# Patient Record
Sex: Male | Born: 1995 | Race: Black or African American | Hispanic: No | Marital: Single | Smoking: Current some day smoker
Health system: Southern US, Community
[De-identification: ages and names within clinical notes are randomized; demographics above are authoritative.]

## PROBLEM LIST (undated history)

## (undated) DIAGNOSIS — J45909 Unspecified asthma, uncomplicated: Secondary | ICD-10-CM

## (undated) DIAGNOSIS — K219 Gastro-esophageal reflux disease without esophagitis: Secondary | ICD-10-CM

## (undated) DIAGNOSIS — R569 Unspecified convulsions: Secondary | ICD-10-CM

## (undated) DIAGNOSIS — IMO0001 Reserved for inherently not codable concepts without codable children: Secondary | ICD-10-CM

## (undated) HISTORY — DX: Unspecified asthma, uncomplicated: J45.909

## (undated) HISTORY — PX: CONCEALED PENIS REPAIR: SHX1387

## (undated) HISTORY — PX: DENTAL SURGERY: SHX609

## (undated) HISTORY — DX: Unspecified convulsions: R56.9

---
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
## 2015-01-03 ENCOUNTER — Ambulatory Visit
Admit: 2015-01-03 | Disposition: A | Discharge status: Home / Self Care | Payer: Self-pay | Attending: Family Medicine | Admitting: Family Medicine

## 2015-01-16 NOTE — H&P (Signed)
PATIENT NAME:  Jordan Ferguson, Azam F MR#:  [id number] DATE OF BIRTH:  May 15, 1996  DATE OF ADMISSION:  04/01/2013  REFERRING PHYSICIAN: Dr. Brandt LoosenJulie Manly.   PRIMARY CARE PHYSICIAN: Dr. Fredric MareBailey in [place].   CHIEF COMPLAINT: Shortness of breath.   HISTORY OF PRESENT ILLNESS: This is a 19 year old young male with significant past medical history of asthma. Reports the last time he was admitted was before 2 years as an inpatient for asthma exacerbation, but reports he was never intubated. The patient reports he has been complaining of shortness of breath, cough and wheezing for the last 48 hours. Was yesterday at Urgent Care, where he was given IM steroid shot and was started on p.o. prednisone tapering dose. The patient reports he has been taking his Xopenex nebulizer regularly. As well, he has been taking his albuterol inhalers frequently without much improvement of his symptoms which prompted him to come to the Emergency Department. Initially, the patient was saturating in the high 80s on room air. He had significant wheezing. He received nebulizer and magnesium sulfate with improvement of his wheezing and symptoms, but the fact that the patient still remained tachycardic and with low oxygen saturation, hospitalist service was requested to admit the patient for further management and treatment of his asthma exacerbation. The patient's chest x-ray does not show any opacity or infiltrate. The patient denies any productive sputum or phlegm. Denies chest pain or hemoptysis. Denies any fever or chills.   PAST MEDICAL HISTORY:  1. Asthma.  2. Seizure in childhood, resolved.   PAST SURGICAL HISTORY: None.   SOCIAL HISTORY: No history of smoking or alcohol abuse. The patient is in high school.   FAMILY HISTORY: Significant for asthma in his mother.   ALLERGIES:  1. AMOXICILLIN.  2. LAMICTAL.  3. PENICILLIN.   HOME MEDICATIONS:  1. Ventolin inhalational 4 to 6 times a day as needed.  2. Xopenex nebulizers.   3. Was started yesterday on p.o. prednisone tapering dose.   REVIEW OF SYSTEMS:  CONSTITUTIONAL: Denies fever, chills, fatigue, weakness, weight gain, weight loss.  EYES: Denies blurry vision, double vision, pain, inflammation.  ENT: Denies tinnitus, ear pain, hearing loss, epistaxis or discharge.  RESPIRATORY: Complains of cough but denies any sputum or productive phlegm. Had wheezing, dyspnea. Denies any COPD or hemoptysis.  CARDIOVASCULAR: Denies chest pain, edema, palpitations, syncope.  GASTROINTESTINAL: Denies nausea, vomiting, diarrhea, abdominal pain, jaundice, rectal bleed, bright red blood per rectum or melena.  GENITOURINARY: Denies dysuria, hematuria or renal colic.  ENDOCRINE: Denies polyuria or polydipsia, heat or cold intolerance.  HEMATOLOGY: Denies anemia, easy bruising, bleeding diathesis  INTEGUMENTARY: Denies acne, rash or skin lesions.  MUSCULOSKELETAL: Denies arthritis, cramps, any joint swelling or pain.  NEUROLOGIC: Denies weakness, dysarthria, epilepsy, tremors migraine.  PSYCHIATRIC: Denies anxiety, insomnia, substance abuse.   PHYSICAL EXAMINATION:  VITAL SIGNS: Temperature 98, pulse 124, respiratory rate 24, blood pressure 130/61, saturating 92% on room air.  GENERAL: Young man, looks comfortable in bed, in no apparent distress.  HEENT: Head atraumatic, normocephalic. Pupils equal and reactive to light. Pink conjunctivae. Anicteric sclerae. Moist oral mucosa.  NECK: Supple. No thyromegaly. No JVD. No stridor.  CHEST: The patient had decreased air entry bilaterally with mild end expiratory wheezing. No rales, rhonchi.  CARDIOVASCULAR: S1, S2 heard. No rubs, murmurs or gallops. Tachycardic.  ABDOMEN: Soft, nontender, nondistended. Bowel sounds present.  EXTREMITIES: No edema. No clubbing. No cyanosis. Pulses +2.  PSYCHIATRIC: Pleasant, appropriate affect. Awake, alert x3. Intact judgment and insight.  SKIN:  Normal skin turgor. Warm and dry.  NEUROLOGIC: Cranial  nerves grossly intact. Motor 5 out of 5. No focal deficits. Sensation is intact to light touch and symmetrical.  LYMPHATICS: No cervical or supraclavicular lymphadenopathy.   PERTINENT LABS: Urinalysis pending. CBC, BMP were ordered, and the results are pending.   Chest x-ray does not show any evidence of opacity or infiltrate, showing mildly hyperinflated lungs.   ASSESSMENT AND PLAN:  1. Asthma exacerbation: This is a 19 year old male with history of asthma. Presents with shortness of breath and wheezing. Will treat for asthma exacerbation. He will be admitted given the fact he is still tachycardic and hypoxic. He will be kept on oxygen to keep oxygen saturation more than 98%. Will be started on intravenous Solu-Medrol 60 every 8 hours. Will be started on ipratropium/levalbuterol every 4 hours and p.r.n. Xopenex in between. He already received magnesium sulfate in the ED. Has no productive sputum. No infiltrate on the chest x-ray, so there is no indication for antibiotics. He will be kept on proton pump inhibitor given the fact he is on intravenous steroids.  2. Tachycardia: This is sinus tachycardia and is related to his asthma exacerbation.  3. Deep vein thrombosis prophylaxis: The patient is young and ambulatory. He will be kept on sequential compression device.   CODE STATUS: FULL CODE.   TOTAL TIME SPENT ON ADMISSION AND PATIENT CARE: 40 minutes.   ____________________________ Starleen Arms, MD dse:gb D: 04/01/2013 05:04:41 ET T: 04/01/2013 05:51:22 ET JOB#: [id number]  cc: Starleen Arms, MD, <Dictator> Jovanni Rash Teena Irani MD ELECTRONICALLY SIGNED 04/01/2013 6:46

## 2015-01-16 NOTE — Discharge Summary (Signed)
PATIENT NAME:  Jordan Ferguson, Jordan Ferguson MR#:  [id number] DATE OF BIRTH:  [birth year]  DATE OF ADMISSION:  04/01/2013 DATE OF DISCHARGE:  04/02/2013  ADMITTING PHYSICIAN: Dr. Randol KernElgergawy.  DISCHARGING PHYSICIAN: Dr. Enid Baasadhika Domitila Stetler.   PRIMARY CARE PHYSICIAN: Dr. [facility].   CONSULTATIONS IN THE HOSPITAL: None.   DISCHARGE DIAGNOSES:  1.  Acute hypoxic respiratory failure.  2.  Right upper lobe pneumonia. 3.  Acute asthma exacerbation.  DISCHARGE HOME MEDICATIONS:  1. Xopenex nebulizer, 3 mL, use one 3 to 4 times a day as needed for shortness of breath, wheezing. 2.  Ventolin inhaler 2 puffs 4 times a day as needed for shortness of breath, wheezing.  3.  Levaquin 500 mg p.o. daily for 5 days.  4.  Prednisone taper, over 6 days.   Discharge home oxygen: None.   DISCHARGE ACTIVITY: As tolerated.    FOLLOWUP INSTRUCTIONS: The patient advised to follow up with either Dr. Tammy SoursScott Bailey or Dr[facility]uer[facility] in 1 week.   LABS AND IMAGING STUDIES PRIOR TO DISCHARGE: WBC 15.0; elevated the steroids. Hemoglobin 14.8, hematocrit 42.0, platelet count is 231.   Sodium 139, potassium 4.2, chloride 107, bicarb 24, BUN 14, creatinine 0.89, glucose 163 and calcium of 8.9.   Strep throat culture has remained negative.   Chest x-ray showing patchy density in the right upper lobe along the minor fissure.   BRIEF HOSPITAL COURSE: Mr. Jordan Ferguson is a 19 year old African American male with a past medical history significant for asthma, admitted to the hospital secondary to acute asthma exacerbation. The patient was found to be hypoxic, requiring 3 liters of oxygen on admission.   1. Acute hypoxic respiratory failure secondary to right upper lobe pneumonia and asthma exacerbation: The patient was treated with IV Solu-Medrol and also nebulizer treatments, and his breathing has significantly improved and he has been ambulating on room air with sats greater than 95% at  this time. He is being tapered to p.o. prednisone and Levaquin is being continued at this time, and he will follow up with his pediatrician in the next week. He already has a prescription for Xopenex as needed for wheezing, and also has a p.r.n. inhaler. His course has been otherwise uneventful in the hospital. His mother has been at the patient's bedside all through his hospital course and all the discharge instructions and the plan of treatment has been discussed with mother prior to discharge.   DISCHARGE CONDITION: Stable.   DISCHARGE DISPOSITION: Home.   Time spent on discharge: Forty minutes.    ____________________________ Enid Baasadhika Mandela Bello, MD rk:dm D: 04/02/2013 15:05:00 ET T: 04/02/2013 15:21:45 ET JOB#: [id number]  cc: Enid Baasadhika Lakeidra Reliford, MD, <Dictator> [facility] Enid BaasADHIKA Chairty Toman MD ELECTRONICALLY SIGNED 04/22/2013 13:23

## 2015-06-15 ENCOUNTER — Ambulatory Visit: Payer: Federal, State, Local not specified - PPO

## 2015-06-15 ENCOUNTER — Encounter: Payer: Self-pay | Admitting: Emergency Medicine

## 2015-06-15 ENCOUNTER — Ambulatory Visit
Admission: EM | Admit: 2015-06-15 | Discharge: 2015-06-15 | Disposition: A | Discharge status: Home / Self Care | Payer: Federal, State, Local not specified - PPO | Attending: Family Medicine | Admitting: Family Medicine

## 2015-06-15 DIAGNOSIS — S92002A Unspecified fracture of left calcaneus, initial encounter for closed fracture: Secondary | ICD-10-CM | POA: Diagnosis not present

## 2015-06-15 DIAGNOSIS — R52 Pain, unspecified: Secondary | ICD-10-CM

## 2015-06-15 DIAGNOSIS — S93105A Unspecified dislocation of left toe(s), initial encounter: Secondary | ICD-10-CM | POA: Diagnosis not present

## 2015-06-15 MED ORDER — IBUPROFEN 800 MG PO TABS: 800.0000 mg | ORAL_TABLET | Freq: Three times a day (TID) | ORAL | Status: DC | PRN
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
## 2015-06-15 NOTE — Discharge Instructions (Signed)
Keep in splint and use crutches. Ice and elevate. Rest.   Follow up with orthopedic this week as discussed. See above to call to schedule tomorrow morning.   Return to Urgent care for new or worsening concerns.   Calcaneal Fracture  There are many different ways of treating fractures of the large irregular bone in the foot that makes up the heel of the foot (calcaneus). Calcaneal fractures can be treated with:   Immobilization--The fracture is casted as it is without changing the positions of the fracture involved.   Closed reduction--The bones are manipulated back into position without opening the site of the fracture using surgery.   Open reduction and internal fixation--The fracture site is opened and the bone pieces are fixed into place with some type of hardware (such as a screw).   Primary arthrodesis--The joint has enough damage that a procedure is done as the first treatment which will leave the joint permanently stiff. This will decrease function, however usually will leave the joint pain free. LET [facility] CARE PROVIDER KNOW ABOUT:  Any allergies you have.   All medicines you are taking, including vitamins, herbs, eye drops, creams, and over-the-counter medicines.   Previous problems you or members of your family have had with the use of anesthetics.  Any blood disorders you have.   Previous surgeries you have had.   Medical conditions you have.  RISKS AND COMPLICATIONS Generally, calcaneal fracture repair is a safe procedure. However, as with any procedure, complications can occur. Possible complications include:   Swelling of the foot and ankle.  Infection of the wound or bone.  Arthritis.  Chronic pain of the foot.  Nerve injury.  Blood clot in the legs or lungs. BEFORE THE PROCEDURE  Ask your health care provider about changing or stopping your regular medicines. You may need to stop taking certain medicines, such as aspirin or blood thinners, at  least 1 week before the surgery.  X-rays and any imaging studies are reviewed with your healthcare provider. The surgeon will advise you on the best surgical approach to repair your fracture.  Do not eat or drink anything for at least 8 hours before the surgery or as directed by your health care provider.   If you smoke, do not smoke for at least 2 weeks before the surgery.   Make plans to have someone drive you home after the procedure. Also arrange for someone to help you with activities during recovery.  PROCEDURE   You will be given medicine to help you relax (sedative). You will then be given medicine to make you sleep through the procedure (general anesthetic). These medicines will be given through an IV access tube that is put into one of your veins.   A nerve block or numbing medicine (local anesthetic) may also be used to keep you comfortable.  Once you are asleep, the foot will be cleaned and shaved if needed.  The surgeon may use a percutaneous or open technique for this surgery:  In the percutaneous approach, small cuts and pins are used to repair the fracture.  In the open technique, a cut is made along the outside of the foot and the bone pieces are placed back together with hardware. A drain may be left to collect fluid. It is removed 3-4 days after the procedure.  The surgeon then uses staples or stitches to close the incision or cuts. AFTER THE PROCEDURE  After surgery you will be taken to the recovery area where  a nurse will watch and check your progress for 1-3 hours. Once you are awake, stable, and taking fluids well, and if you do not have any other problems, you will be allowed to go home.  You will be given pain medicine if needed.  The IV access tube will be removed before you are discharged. Document Released: 06/22/2005 Document Revised: 07/03/2013 Document Reviewed: 04/16/2013 [facility] Patient Information 2015 [place], [place]. This information is not  intended to replace advice given to you by your health care provider. Make sure you discuss any questions you have with your health care provider.   Buddy Taping of Toes We have taped your toes together to keep them from moving. This is called "buddy taping" since we used a part of your own body to keep the injured part still. We placed soft padding between your toes to keep them from rubbing against each other. Buddy taping will help with healing and to reduce pain. Keep your toes buddy taped together for as long as directed by your caregiver. HOME CARE INSTRUCTIONS   Raise your injured area above the level of your heart while sitting or lying down. Prop it up with pillows.  An ice pack used every twenty minutes, while awake, for the first one to two days may be helpful. Put ice in a plastic bag and put a towel between the bag and your skin.  Watch for signs that the taping is too tight. These signs may be:  Numbness of your taped toes.  Coolness of your taped toes.  Color change in the area beyond the tape.  Increased pain.  If you have any of these signs, loosen or rewrap the tape. If you need to loosen or rewrap the buddy tape, make sure you use the padding again. SEEK IMMEDIATE MEDICAL CARE IF:   You have worse pain, swelling, inflammation (soreness), drainage or bleeding after you rewrap the tape.  Any new problems occur. MAKE SURE YOU:   Understand these instructions.  Will watch your condition.  Will get help right away if you are not doing well or get worse. Document Released: 06/16/2004 Document Revised: 12/05/2011 Document Reviewed: 09/09/2008 [facility] Patient Information 2015 [place], [place]. This information is not intended to replace advice given to you by your health care provider. Make sure you discuss any questions you have with your health care provider.  Toe Dislocation Toe dislocation is the displacement of the large bone of your toe (metatarsal) from the  socket that connects it to your foot. Very strong, fibrous tissues (ligaments) connect your toe to a bone in your foot and stabilize the joint where these two bones meet. Dislocation is caused by a forceful impact to your toe. This impact moves your toe off the joint and often injures the ligaments that support it. SYMPTOMS Symptoms of toe dislocation include:  Noticeable deformity of your toe.  Pain, with loss of movement.  Looseness in your joint, indicating a tear of the ligaments (severe dislocations). DIAGNOSIS Toe dislocation is diagnosed through a physical exam. Often, X-ray exams are done to see if you have associated injuries, such as bone fractures. TREATMENT Toe dislocations are treated by putting your bones back into position (reduction) either by manipulation or through surgery. The toe is then kept in a fixed position (immobilized) in a cast, splint, or rigid postoperative shoe. In rare cases, surgical repair of a ligament is required, followed by immobilization of the toe.  HOME CARE INSTRUCTIONS The following measures can help to  reduce pain and speed up the healing process:  Rest your injured joint. Do not move it. Avoid activities similar to the one that caused your injury.  Apply ice to your injured joint for 1 to 2 days after your reduction or as directed by your caregiver. Applying ice helps to reduce inflammation and pain.  Put ice in a plastic bag.  Place a towel between your skin and the bag.  Leave the ice on for 15 to 20 minutes at a time, every 2 hours while you are awake.  Elevate your foot above your heart as directed by your caregiver.  Take over-the-counter or prescription medicine for pain as directed by your caregiver.  If your caregiver has taped your toe to an adjacent toe, change the tape as directed by your caregiver. SEEK IMMEDIATE MEDICAL CARE IF:  Your cast or splint becomes loose or damaged.  Your pain becomes worse, not better.  You lose  feeling in your toe, or you cannot bend the tip of your toe. MAKE SURE YOU:  Understand these instructions.  Will watch your condition.  Will get help right away if you are not doing well or get worse. Document Released: 06/07/2001 Document Revised: 01/27/2014 Document Reviewed: 02/10/2011 [facility] Patient Information 2015 [place], [place]. This information is not intended to replace advice given to you by your health care provider. Make sure you discuss any questions you have with your health care provider.
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
## 2015-06-19 ENCOUNTER — Other Ambulatory Visit: Payer: Self-pay | Admitting: Orthopedic Surgery

## 2015-06-19 DIAGNOSIS — S92202A Fracture of unspecified tarsal bone(s) of left foot, initial encounter for closed fracture: Secondary | ICD-10-CM

## 2015-06-22 ENCOUNTER — Ambulatory Visit: Payer: Federal, State, Local not specified - PPO

## 2015-06-25 ENCOUNTER — Ambulatory Visit: Payer: Federal, State, Local not specified - PPO

## 2015-07-18 ENCOUNTER — Encounter: Payer: Self-pay | Admitting: *Deleted

## 2015-07-18 ENCOUNTER — Ambulatory Visit (INDEPENDENT_AMBULATORY_CARE_PROVIDER_SITE_OTHER): Payer: Federal, State, Local not specified - PPO

## 2015-07-18 ENCOUNTER — Ambulatory Visit
Admission: EM | Admit: 2015-07-18 | Discharge: 2015-07-18 | Disposition: A | Discharge status: Home / Self Care | Payer: Federal, State, Local not specified - PPO | Attending: Emergency Medicine | Admitting: Emergency Medicine

## 2015-07-18 DIAGNOSIS — J45901 Unspecified asthma with (acute) exacerbation: Secondary | ICD-10-CM

## 2015-07-18 MED ORDER — PREDNISONE 20 MG PO TABS: 60.0000 mg | ORAL_TABLET | Freq: Every day | ORAL | Status: DC

## 2015-07-18 MED ORDER — PREDNISONE 50 MG PO TABS
60.0000 mg | ORAL_TABLET | Freq: Once | ORAL | Status: AC
  Administered 2015-07-18: 60 mg via ORAL

## 2015-07-18 MED ORDER — IPRATROPIUM-ALBUTEROL 0.5-2.5 (3) MG/3ML IN SOLN
3.0000 mL | Freq: Once | RESPIRATORY_TRACT | Status: AC
  Administered 2015-07-18: 3 mL via RESPIRATORY_TRACT

## 2015-07-18 MED ORDER — ALBUTEROL SULFATE (2.5 MG/3ML) 0.083% IN NEBU
2.5000 mg | INHALATION_SOLUTION | Freq: Once | RESPIRATORY_TRACT | Status: AC
  Administered 2015-07-18: 2.5 mg via RESPIRATORY_TRACT

## 2015-07-18 MED ORDER — BUDESONIDE-FORMOTEROL FUMARATE 80-4.5 MCG/ACT IN AERO: 2.0000 | INHALATION_SPRAY | Freq: Two times a day (BID) | RESPIRATORY_TRACT | Status: DC

## 2015-07-18 MED ORDER — ALBUTEROL SULFATE HFA 108 (90 BASE) MCG/ACT IN AERS: 2.0000 | INHALATION_SPRAY | RESPIRATORY_TRACT | Status: DC | PRN

## 2015-07-18 NOTE — ED Provider Notes (Signed)
HPI  SUBJECTIVE:  Jordan Ferguson is a 19 y.o. male  with h/o asthma with 2 days of coughing, wheezing, SOB, chest tightness, DOE. Using rescue inhaler twice, had 2 nebulizer treatments this morning w/o relief.  He reports nasal congestion, sore throat, allergy type symptoms with sneezing. He denies difficulty talking, nausea, vomiting, fevers, postnasal drip, sinus pain/pressure, chest pain.Marland Kitchen He has been is otherwise usual state health up until yesterday. Denies lower extremity edema, calf pain or swelling. No surgery past 4 weeks, prolonged immobilization, history of DVT, PE, cancer, CHF. Past medical history for seizures, asthma. last admission the hospital 2 years ago. No intubations. He is prescribed Singulair, but does not use it. Is also prescribed Advair, but does not use them on a regular basis. Normally needs his rescue inhaler as needed.    Past Medical History  Diagnosis Date  . Asthma   . Seizures (HCC)     None in many years, no meds for years    History reviewed. No pertinent past surgical history.  No family history on file.  Social History  Substance Use Topics  . Smoking status: Never Smoker   . Smokeless tobacco: Never Used  . Alcohol Use: No    No current facility-administered medications for this encounter.  Current outpatient prescriptions:  .  albuterol (VENTOLIN HFA) 108 (90 BASE) MCG/ACT inhaler, Inhale 2 puffs into the lungs every 4 (four) hours as needed for wheezing., Disp: 8.5 g, Rfl: 0 .  budesonide-formoterol (SYMBICORT) 80-4.5 MCG/ACT inhaler, Inhale 2 puffs into the lungs 2 (two) times daily., Disp: 1 Inhaler, Rfl: 1 .  Cetirizine HCl (ZYRTEC ALLERGY) 10 MG CAPS, Take 1 capsule (10 mg total) by mouth daily., Disp: , Rfl:  .  fluticasone (FLONASE) 50 MCG/ACT nasal spray, Place 2 sprays into both nostrils daily., Disp: 16 g, Rfl: 3 .  ibuprofen (ADVIL,MOTRIN) 800 MG tablet, Take 1 tablet (800 mg total) by mouth every 8 (eight) hours as needed for  mild pain or moderate pain., Disp: 15 tablet, Rfl: 0 .  levalbuterol (XOPENEX) 1.25 MG/3ML nebulizer solution, , Disp: , Rfl:  .  predniSONE (DELTASONE) 20 MG tablet, Take 3 tablets (60 mg total) by mouth daily., Disp: 15 tablet, Rfl: 0  Allergies  Allergen Reactions  . Amoxicillin Rash  . Lamictal [Lamotrigine] Rash     ROS  As noted in HPI.   Physical Exam  BP 128/82 mmHg  Pulse 97  Temp(Src) 97.6 F (36.4 C) (Oral)  Resp 20  Ht  (1.93 m)  Wt 214 lb (97.07 kg)  BMI 26.06 kg/m2  SpO2 97%  Constitutional: Well developed, well nourished, no acute distress. Speaking in full sentences.  Eyes: PERRL, EOMI, conjunctiva normal bilaterally HENT: Normocephalic, atraumatic,mucus membranes moist + nasal congestion Respiratory: Fair air movement, wheezing on the right more than left, prolonged expiratory phase. No chest wall tenderness Cardiovascular: Normal rate and rhythm, no murmurs, no gallops, no rubs GI: nondistended ,skin: No rash, skin intact Musculoskeletal: No edema, calves symmetric, no tenderness, no deformities Neurologic: Alert & oriented x 3, CN II-XII grossly intact, no motor deficits, sensation grossly intact Psychiatric: Speech and behavior appropriate   ED Course   Medications  ipratropium-albuterol (DUONEB) 0.5-2.5 (3) MG/3ML nebulizer solution 3 mL (3 mLs Nebulization Given 07/18/15 0958)  albuterol (PROVENTIL) (2.5 MG/3ML) 0.083% nebulizer solution 2.5 mg (2.5 mg Nebulization Given 07/18/15 0959)  predniSONE (DELTASONE) tablet 60 mg (60 mg Oral Given 07/18/15 0948)    Orders Placed This Encounter  Procedures  . DG Chest 2 View    Standing Status: Standing     Number of Occurrences: 1     Standing Expiration Date:     Order Specific Question:  Reason for Exam (SYMPTOM  OR DIAGNOSIS REQUIRED)    Answer:  wheez R>L r/o PNA   No results found for this or any previous visit (from the past 24 hour(s)). Dg Chest 2 View  07/18/2015  CLINICAL DATA:   Asthma with wheezing for 2 days EXAM: CHEST  2 VIEW COMPARISON:  01/03/2015 FINDINGS: Normal heart size, mediastinal contours, and pulmonary vascularity. Hyperinflated lungs consistent with history of asthma. Decreased peribronchial thickening since previous study. No pulmonary infiltrate, pleural effusion or pneumothorax. Bones unremarkable. IMPRESSION: Hyperinflation consistent with history of asthma. No acute infiltrate. Electronically Signed   By: Ulyses SouthwardMark  Boles M.D.   On: 07/18/2015 10:08    ED Clinical Impression  Asthma, unspecified asthma severity, with acute exacerbation  ED Assessment/Plan  Pt seen and examined. Pt with an asthma exacerbation likely secondary to allergies speaking in full sentences but has poor air movement,  wheezing on the right worse than left., Pt given 5 mg albuterol/0.5 mg atrovent, prednisone 60 mg for asthma exacerbation. We'll do a chest x-ray because of wheezing worse on the right to rule out pneumonia.  Patient does not meet the perc rule due to initial oxygen saturation, but modified well's score is 8 out of 9. Seriously doubt PE. Initial O2 sat 90% on room air.   Reviewed imaging independently. , no pneumonia. Hyperinflation. See rads report for details.  On re-evaluation, pt feels much improved after medication, pt comfortable, VSS. Oxygen saturation 97%. Improved air movement, no wheezing on repeat physical exam.    Discussed labs, imaging, MDM, plan and followup with patient / parent / family. Emphasized importance of f/u.  Discussed sn/sx that should prompt return to the UC or ED. Pt / parent / family agrees with plan.    *This clinic note was created using Dragon dictation software. Therefore, there may be occasional mistakes despite careful proofreading.  Domenick GongAshley Rawad Bochicchio, MD 07/18/15 1349

## 2015-07-18 NOTE — Discharge Instructions (Signed)
Take two puffs from your albuterol inhaler every 4 hours for several days. Finish the steroids unless your doctor tells you to stop. You may decrease the frequency of your albuterol inhaler as  you start feeling better. You may start the steroids tomorrow, as you have already taken today's dose.  Make sure you drink extra fluids. Return if you get worse, have a fever >100.4, or any other concerns.   Go to www.goodrx.com to look up your medications. This will give you a list of where you can find your prescriptions at the most affordable prices.

## 2015-07-18 NOTE — ED Notes (Signed)
Pt started with wheezing and nasal congestion yesterday

## 2015-09-06 ENCOUNTER — Ambulatory Visit
Admission: EM | Admit: 2015-09-06 | Discharge: 2015-09-06 | Disposition: A | Discharge status: Home / Self Care | Payer: Federal, State, Local not specified - PPO | Attending: Family Medicine | Admitting: Family Medicine

## 2015-09-06 DIAGNOSIS — H6593 Unspecified nonsuppurative otitis media, bilateral: Secondary | ICD-10-CM

## 2015-09-06 DIAGNOSIS — J019 Acute sinusitis, unspecified: Secondary | ICD-10-CM

## 2015-09-06 DIAGNOSIS — J4531 Mild persistent asthma with (acute) exacerbation: Secondary | ICD-10-CM

## 2015-09-06 MED ORDER — LEVALBUTEROL HCL 1.25 MG/3ML IN NEBU: 1.2500 mg | INHALATION_SOLUTION | Freq: Four times a day (QID) | RESPIRATORY_TRACT | Status: DC | PRN

## 2015-09-06 MED ORDER — PREDNISONE 20 MG PO TABS: 60.0000 mg | ORAL_TABLET | Freq: Every day | ORAL | Status: AC

## 2015-09-06 MED ORDER — ALBUTEROL SULFATE HFA 108 (90 BASE) MCG/ACT IN AERS: 2.0000 | INHALATION_SPRAY | RESPIRATORY_TRACT | Status: DC | PRN

## 2015-09-06 MED ORDER — MONTELUKAST SODIUM 10 MG PO TABS: 10.0000 mg | ORAL_TABLET | Freq: Every day | ORAL | Status: DC

## 2015-09-06 MED ORDER — SALINE SPRAY 0.65 % NA SOLN: 2.0000 | NASAL | Status: AC

## 2015-09-06 MED ORDER — FLUTICASONE PROPIONATE 50 MCG/ACT NA SUSP: 1.0000 | Freq: Every day | NASAL | Status: DC

## 2015-09-06 NOTE — Discharge Instructions (Signed)
Asthma Attack Prevention °While you may not be able to control the fact that you have asthma, you can take actions to prevent asthma attacks. The best way to prevent asthma attacks is to maintain good control of your asthma. You can achieve this by: °· Taking your medicines as directed. °· Avoiding things that can irritate your airways or make your asthma symptoms worse (asthma triggers). °· Keeping track of how well your asthma is controlled and of any changes in your symptoms. °· Responding quickly to worsening asthma symptoms (asthma attack). °· Seeking emergency care when it is needed. °WHAT ARE SOME WAYS TO PREVENT AN ASTHMA ATTACK? °Have a Plan °Work with your health care provider to create a written plan for managing and treating your asthma attacks (asthma action plan). This plan includes: °· A list of your asthma triggers and how you can avoid them. °· Information on when medicines should be taken and when their dosages should be changed. °· The use of a device that measures how well your lungs are working (peak flow meter). °Monitor Your Asthma °Use your peak flow meter and record your results in a journal every day. A drop in your peak flow numbers on one or more days may indicate the start of an asthma attack. This can happen even before you start to feel symptoms. You can prevent an asthma attack from getting worse by following the steps in your asthma action plan. °Avoid Asthma Triggers °Work with your asthma health care provider to find out what your asthma triggers are. This can be done by: °· Allergy testing. °· Keeping a journal that notes when asthma attacks occur and the factors that may have contributed to them. °· Determining if there are other medical conditions that are making your asthma worse. °Once you have determined your asthma triggers, take steps to avoid them. This may include avoiding excessive or prolonged exposure to: °· Dust. Have someone dust and vacuum your home for you once or  twice a week. Using a high-efficiency particulate arrestance (HEPA) vacuum is best. °· Smoke. This includes campfire smoke, forest fire smoke, and secondhand smoke from tobacco products. °· Pet dander. Avoid contact with animals that you know you are allergic to. °· Allergens from trees, grasses or pollens. Avoid spending a lot of time outdoors when pollen counts are high, and on very windy days. °· Very cold, dry, or humid air. °· Mold. °· Foods that contain high amounts of sulfites. °· Strong odors. °· Outdoor air pollutants, such as engine exhaust. °· Indoor air pollutants, such as aerosol sprays and fumes from household cleaners. °· Household pests, including dust mites and cockroaches, and pest droppings. °· Certain medicines, including NSAIDs. Always talk to your health care provider before stopping or starting any new medicines. °Medicines °Take over-the-counter and prescription medicines only as told by your health care provider. Many asthma attacks can be prevented by carefully following your medicine schedule. Taking your medicines correctly is especially important when you cannot avoid certain asthma triggers. °Act Quickly °If an asthma attack does happen, acting quickly can decrease how severe it is and how long it lasts. Take these steps:  °· Pay attention to your symptoms. If you are coughing, wheezing, or having difficulty breathing, do not wait to see if your symptoms go away on their own. Follow your asthma action plan. °· If you have followed your asthma action plan and your symptoms are not improving, call your health care provider or seek immediate medical care   at the nearest hospital. It is important to note how often you need to use your fast-acting rescue inhaler. If you are using your rescue inhaler more often, it may mean that your asthma is not under control. Adjusting your asthma treatment plan may help you to prevent future asthma attacks and help you to gain better control of your  condition. HOW CAN I PREVENT AN ASTHMA ATTACK WHEN I EXERCISE? Follow advice from your health care provider about whether you should use your fast-acting inhaler before exercising. Many people with asthma experience exercise-induced bronchoconstriction (EIB). This condition often worsens during vigorous exercise in cold, humid, or dry environments. Usually, people with EIB can stay very active by pre-treating with a fast-acting inhaler before exercising.   This information is not intended to replace advice given to you by your health care provider. Make sure you discuss any questions you have with your health care provider.   Document Released: 08/31/2009 Document Revised: 06/03/2015 Document Reviewed: 02/12/2015 Elsevier Interactive Patient Education 2016 [employer].  Asthma, Adult Asthma is a recurring condition in which the airways tighten and narrow. Asthma can make it difficult to breathe. It can cause coughing, wheezing, and shortness of breath. Asthma episodes, also called asthma attacks, range from minor to life-threatening. Asthma cannot be cured, but medicines and lifestyle changes can help control it. CAUSES Asthma is believed to be caused by inherited (genetic) and environmental factors, but its exact cause is unknown. Asthma may be triggered by allergens, lung infections, or irritants in the air. Asthma triggers are different for each person. Common triggers include:   Animal dander.  Dust mites.  Cockroaches.  Pollen from trees or grass.  Mold.  Smoke.  Air pollutants such as dust, household cleaners, hair sprays, aerosol sprays, paint fumes, strong chemicals, or strong odors.  Cold air, weather changes, and winds (which increase molds and pollens in the air).  Strong emotional expressions such as crying or laughing hard.  Stress.  Certain medicines (such as aspirin) or types of drugs (such as beta-blockers).  Sulfites in foods and drinks. Foods and drinks that  may contain sulfites include dried fruit, potato chips, and sparkling grape juice.  Infections or inflammatory conditions such as the flu, a cold, or an inflammation of the nasal membranes (rhinitis).  Gastroesophageal reflux disease (GERD).  Exercise or strenuous activity. SYMPTOMS Symptoms may occur immediately after asthma is triggered or many hours later. Symptoms include:  Wheezing.  Excessive nighttime or early morning coughing.  Frequent or severe coughing with a common cold.  Chest tightness.  Shortness of breath. DIAGNOSIS  The diagnosis of asthma is made by a review of your medical history and a physical exam. Tests may also be performed. These may include:  Lung function studies. These tests show how much air you breathe in and out.  Allergy tests.  Imaging tests such as X-rays. TREATMENT  Asthma cannot be cured, but it can usually be controlled. Treatment involves identifying and avoiding your asthma triggers. It also involves medicines. There are 2 classes of medicine used for asthma treatment:   Controller medicines. These prevent asthma symptoms from occurring. They are usually taken every day.  Reliever or rescue medicines. These quickly relieve asthma symptoms. They are used as needed and provide short-term relief. Your health care provider will help you create an asthma action plan. An asthma action plan is a written plan for managing and treating your asthma attacks. It includes a list of your asthma triggers and how they  may be avoided. It also includes information on when medicines should be taken and when their dosage should be changed. An action plan may also involve the use of a device called a peak flow meter. A peak flow meter measures how well the lungs are working. It helps you monitor your condition. HOME CARE INSTRUCTIONS   Take medicines only as directed by your health care provider. Speak with your health care provider if you have questions about  how or when to take the medicines.  Use a peak flow meter as directed by your health care provider. Record and keep track of readings.  Understand and use the action plan to help minimize or stop an asthma attack without needing to seek medical care.  Control your home environment in the following ways to help prevent asthma attacks:  Do not smoke. Avoid being exposed to secondhand smoke.  Change your heating and air conditioning filter regularly.  Limit your use of fireplaces and wood stoves.  Get rid of pests (such as roaches and mice) and their droppings.  Throw away plants if you see mold on them.  Clean your floors and dust regularly. Use unscented cleaning products.  Try to have someone else vacuum for you regularly. Stay out of rooms while they are being vacuumed and for a short while afterward. If you vacuum, use a dust mask from a hardware store, a double-layered or microfilter vacuum cleaner bag, or a vacuum cleaner with a HEPA filter.  Replace carpet with wood, tile, or vinyl flooring. Carpet can trap dander and dust.  Use allergy-proof pillows, mattress covers, and box spring covers.  Wash bed sheets and blankets every week in hot water and dry them in a dryer.  Use blankets that are made of polyester or cotton.  Clean bathrooms and kitchens with bleach. If possible, have someone repaint the walls in these rooms with mold-resistant paint. Keep out of the rooms that are being cleaned and painted.  Wash hands frequently. SEEK MEDICAL CARE IF:   You have wheezing, shortness of breath, or a cough even if taking medicine to prevent attacks.  The colored mucus you cough up (sputum) is thicker than usual.  Your sputum changes from clear or white to yellow, green, gray, or bloody.  You have any problems that may be related to the medicines you are taking (such as a rash, itching, swelling, or trouble breathing).  You are using a reliever medicine more than 2-3 times per  week.  Your peak flow is still at 50-79% of your personal best after following your action plan for 1 hour.  You have a fever. SEEK IMMEDIATE MEDICAL CARE IF:   You seem to be getting worse and are unresponsive to treatment during an asthma attack.  You are short of breath even at rest.  You get short of breath when doing very little physical activity.  You have difficulty eating, drinking, or talking due to asthma symptoms.  You develop chest pain.  You develop a fast heartbeat.  You have a bluish color to your lips or fingernails.  You are light-headed, dizzy, or faint.  Your peak flow is less than 50% of your personal best.   This information is not intended to replace advice given to you by your health care provider. Make sure you discuss any questions you have with your health care provider.   Document Released: 09/12/2005 Document Revised: 06/03/2015 Document Reviewed: 04/11/2013 Elsevier Interactive Patient Education 2016 [employer] Acute Bronchitis Bronchitis  is inflammation of the airways that extend from the windpipe into the lungs (bronchi). The inflammation often causes mucus to develop. This leads to a cough, which is the most common symptom of bronchitis.  In acute bronchitis, the condition usually develops suddenly and goes away over time, usually in a couple weeks. Smoking, allergies, and asthma can make bronchitis worse. Repeated episodes of bronchitis may cause further lung problems.  CAUSES Acute bronchitis is most often caused by the same virus that causes a cold. The virus can spread from person to person (contagious) through coughing, sneezing, and touching contaminated objects. SIGNS AND SYMPTOMS   Cough.   Fever.   Coughing up mucus.   Body aches.   Chest congestion.   Chills.   Shortness of breath.   Sore throat.  DIAGNOSIS  Acute bronchitis is usually diagnosed through a physical exam. Your health care provider will also ask  you questions about your medical history. Tests, such as chest X-rays, are sometimes done to rule out other conditions.  TREATMENT  Acute bronchitis usually goes away in a couple weeks. Oftentimes, no medical treatment is necessary. Medicines are sometimes given for relief of fever or cough. Antibiotic medicines are usually not needed but may be prescribed in certain situations. In some cases, an inhaler may be recommended to help reduce shortness of breath and control the cough. A cool mist vaporizer may also be used to help thin bronchial secretions and make it easier to clear the chest.  HOME CARE INSTRUCTIONS  Get plenty of rest.   Drink enough fluids to keep your urine clear or pale yellow (unless you have a medical condition that requires fluid restriction). Increasing fluids may help thin your respiratory secretions (sputum) and reduce chest congestion, and it will prevent dehydration.   Take medicines only as directed by your health care provider.  If you were prescribed an antibiotic medicine, finish it all even if you start to feel better.  Avoid smoking and secondhand smoke. Exposure to cigarette smoke or irritating chemicals will make bronchitis worse. If you are a smoker, consider using nicotine gum or skin patches to help control withdrawal symptoms. Quitting smoking will help your lungs heal faster.   Reduce the chances of another bout of acute bronchitis by washing your hands frequently, avoiding people with cold symptoms, and trying not to touch your hands to your mouth, nose, or eyes.   Keep all follow-up visits as directed by your health care provider.  SEEK MEDICAL CARE IF: Your symptoms do not improve after 1 week of treatment.  SEEK IMMEDIATE MEDICAL CARE IF:  You develop an increased fever or chills.   You have chest pain.   You have severe shortness of breath.  You have bloody sputum.   You develop dehydration.  You faint or repeatedly feel like you are  going to pass out.  You develop repeated vomiting.  You develop a severe headache. MAKE SURE YOU:   Understand these instructions.  Will watch your condition.  Will get help right away if you are not doing well or get worse.   This information is not intended to replace advice given to you by your health care provider. Make sure you discuss any questions you have with your health care provider.   Document Released: 10/20/2004 Document Revised: 10/03/2014 Document Reviewed: 03/05/2013 Elsevier Interactive Patient Education 2016 [employer] Otitis Media With Effusion Otitis media with effusion is the presence of fluid in the middle ear. This is a common  problem in children, which often follows ear infections. It may be present for weeks or longer after the infection. Unlike an acute ear infection, otitis media with effusion refers only to fluid behind the ear drum and not infection. Children with repeated ear and sinus infections and allergy problems are the most likely to get otitis media with effusion. CAUSES  The most frequent cause of the fluid buildup is dysfunction of the eustachian tubes. These are the tubes that drain fluid in the ears to the back of the nose (nasopharynx). SYMPTOMS   The main symptom of this condition is hearing loss. As a result, you or your child may:  Listen to the TV at a loud volume.  Not respond to questions.  Ask "what" often when spoken to.  Mistake or confuse one sound or word for another.  There may be a sensation of fullness or pressure but usually not pain. DIAGNOSIS   Your health care provider will diagnose this condition by examining you or your child's ears.  Your health care provider may test the pressure in you or your child's ear with a tympanometer.  A hearing test may be conducted if the problem persists. TREATMENT   Treatment depends on the duration and the effects of the effusion.  Antibiotics, decongestants, nose drops,  and cortisone-type drugs (tablets or nasal spray) may not be helpful.  Children with persistent ear effusions may have delayed language or behavioral problems. Children at risk for developmental delays in hearing, learning, and speech may require referral to a specialist earlier than children not at risk.  You or your child's health care provider may suggest a referral to an ear, nose, and throat surgeon for treatment. The following may help restore normal hearing:  Drainage of fluid.  Placement of ear tubes (tympanostomy tubes).  Removal of adenoids (adenoidectomy). HOME CARE INSTRUCTIONS   Avoid secondhand smoke.  Infants who are breastfed are less likely to have this condition.  Avoid feeding infants while they are lying flat.  Avoid known environmental allergens.  Avoid people who are sick. SEEK MEDICAL CARE IF:   Hearing is not better in 3 months.  Hearing is worse.  Ear pain.  Drainage from the ear.  Dizziness. MAKE SURE YOU:   Understand these instructions.  Will watch your condition.  Will get help right away if you are not doing well or get worse.   This information is not intended to replace advice given to you by your health care provider. Make sure you discuss any questions you have with your health care provider.   Document Released: 10/20/2004 Document Revised: 10/03/2014 Document Reviewed: 04/09/2013 Elsevier Interactive Patient Education [employer]

## 2015-09-06 NOTE — ED Notes (Signed)
Patient complains of cough, wheezing, body aches. Patient states that symptoms started 2 days ago. He states that yesterday he felt like he had the flu. Patient states that his symptoms have been worsening.

## 2015-09-07 NOTE — ED Provider Notes (Signed)
CSN: [id number]     Arrival date & time 09/06/15  0935 History   First MD Initiated Contact with Patient 09/06/15 1057     Chief Complaint  Patient presents with  . Cough   (Consider location/radiation/quality/duration/timing/severity/associated sxs/prior Treatment) HPI Comments: Single african [place] male still lives at home with mother not in school or working.  Noncompliant with asthma and allergy medications.  Has been using albuterol MDI at night typically when wheezing.  Friday  9 Dec had body aches, intermittent productive cough yellow denied sleep issues.  Fever and chills 9/10 Dec pms none today  Completed  prednisone x5 days from Dr Chaney Malling symptoms improved but have returned.  Sick contacts.  Using symbicort 2puffs every other day.  "I feel like I need oral steroids again"  PMHx asthma, seizures, migraines, ADD  Denied PSHx  FHx ADD, lung cancer  The history is provided by the patient and a parent.    Past Medical History  Diagnosis Date  . Asthma   . Seizures (HCC)     None in many years, no meds for years   History reviewed. No pertinent past surgical history. History reviewed. No pertinent family history. Social History  Substance Use Topics  . Smoking status: Never Smoker   . Smokeless tobacco: Never Used  . Alcohol Use: No    Review of Systems  Constitutional: Positive for fever, chills and fatigue. Negative for diaphoresis, activity change, appetite change and unexpected weight change.  HENT: Positive for congestion. Negative for dental problem, drooling, ear discharge, ear pain, facial swelling, hearing loss, mouth sores, nosebleeds, postnasal drip, rhinorrhea, sinus pressure, sneezing, sore throat, tinnitus, trouble swallowing and voice change.   Eyes: Negative for photophobia, pain, discharge, redness, itching and visual disturbance.  Respiratory: Positive for cough, chest tightness, shortness of breath and wheezing. Negative for choking and stridor.    Cardiovascular: Negative for chest pain, palpitations and leg swelling.  Gastrointestinal: Negative for nausea, vomiting, abdominal pain, diarrhea, constipation, blood in stool and abdominal distention.  Endocrine: Negative for cold intolerance and heat intolerance.  Genitourinary: Negative for dysuria.  Musculoskeletal: Positive for myalgias. Negative for back pain, joint swelling, arthralgias, gait problem, neck pain and neck stiffness.  Skin: Negative for color change, pallor, rash and wound.  Allergic/Immunologic: Positive for environmental allergies. Negative for food allergies and immunocompromised state.  Neurological: Negative for dizziness, tremors, seizures, syncope, facial asymmetry, speech difficulty, weakness, light-headedness, numbness and headaches.  Hematological: Negative for adenopathy. Does not bruise/bleed easily.  Psychiatric/Behavioral: Negative for behavioral problems, confusion, sleep disturbance and agitation.    Allergies  Amoxicillin and Lamictal  Home Medications   Prior to Admission medications   Medication Sig Start Date End Date Taking? Authorizing Provider  budesonide-formoterol (SYMBICORT) 80-4.5 MCG/ACT inhaler Inhale 2 puffs into the lungs 2 (two) times daily. 07/18/15  Yes Domenick Gong, MD  Cetirizine HCl (ZYRTEC ALLERGY) 10 MG CAPS Take 1 capsule (10 mg total) by mouth daily. 11/26/14  Yes Eustaquio Boyden, MD  ibuprofen (ADVIL,MOTRIN) 800 MG tablet Take 1 tablet (800 mg total) by mouth every 8 (eight) hours as needed for mild pain or moderate pain. 06/15/15  Yes Renford Dills, NP  albuterol (VENTOLIN HFA) 108 (90 BASE) MCG/ACT inhaler Inhale 2 puffs into the lungs every 4 (four) hours as needed for wheezing or shortness of breath. 09/06/15 10/07/15  Barbaraann Barthel, NP  fluticasone (FLONASE) 50 MCG/ACT nasal spray Place 1 spray into both nostrils daily. 09/06/15 09/26/15  Barbaraann Barthel, NP  levalbuterol (XOPENEX) 1.25 MG/3ML nebulizer solution  Take 1.25 mg by nebulization every 6 (six) hours as needed for wheezing or shortness of breath. 09/06/15 09/12/15  Barbaraann Barthel, NP  montelukast (SINGULAIR) 10 MG tablet Take 1 tablet (10 mg total) by mouth at bedtime. 09/06/15 10/07/15  Barbaraann Barthel, NP  predniSONE (DELTASONE) 20 MG tablet Take 3 tablets (60 mg total) by mouth daily. 09/06/15 09/10/15  Barbaraann Barthel, NP  sodium chloride (OCEAN) 0.65 % SOLN nasal spray Place 2 sprays into both nostrils every 2 (two) hours while awake. 09/06/15   Barbaraann Barthel, NP   Meds Ordered and Administered this Visit  Medications - No data to display  Pulse 100  Temp(Src) 97.8 F (36.6 C) (Tympanic)  Resp 18  Ht 6\' 4"  (1.93 m)  SpO2 97% No data found.   Physical Exam  Constitutional: He is oriented to person, place, and time. Vital signs are normal. He appears well-developed and well-nourished. He is active and cooperative.  Non-toxic appearance. He does not have a sickly appearance. He does not appear ill. No distress.  HENT:  Head: Normocephalic and atraumatic.  Right Ear: Hearing, external ear and ear canal normal. A middle ear effusion is present.  Left Ear: Hearing, external ear and ear canal normal. A middle ear effusion is present.  Nose: Mucosal edema and rhinorrhea present. No nose lacerations, sinus tenderness, nasal deformity, septal deviation or nasal septal hematoma. No epistaxis.  No foreign bodies. Right sinus exhibits no maxillary sinus tenderness and no frontal sinus tenderness. Left sinus exhibits no maxillary sinus tenderness and no frontal sinus tenderness.  Mouth/Throat: Uvula is midline and mucous membranes are normal. Mucous membranes are not pale, not dry and not cyanotic. He does not have dentures. No oral lesions. No trismus in the jaw. Normal dentition. No dental abscesses, uvula swelling, lacerations or dental caries. Posterior oropharyngeal edema and posterior oropharyngeal erythema present. No oropharyngeal  exudate or tonsillar abscesses.  Cobblestoning posterior pharynx; bilateral TMS with air fluid level clear; bilateral nasal turbinates with edema/erythema clear discharge; nasal congestion speaking full sentences deep breaths without cough  Eyes: Conjunctivae, EOM and lids are normal. Pupils are equal, round, and reactive to light. Right eye exhibits no chemosis, no discharge, no exudate and no hordeolum. No foreign body present in the right eye. Left eye exhibits no chemosis, no discharge, no exudate and no hordeolum. No foreign body present in the left eye. Right conjunctiva is not injected. Right conjunctiva has no hemorrhage. Left conjunctiva is not injected. Left conjunctiva has no hemorrhage. No scleral icterus. Right eye exhibits normal extraocular motion and no nystagmus. Left eye exhibits normal extraocular motion and no nystagmus. Right pupil is round and reactive. Left pupil is round and reactive. Pupils are equal.  Neck: Trachea normal and normal range of motion. Neck supple. No tracheal tenderness, no spinous process tenderness and no muscular tenderness present. No rigidity. No tracheal deviation, no edema, no erythema and normal range of motion present. No thyroid mass and no thyromegaly present.  Cardiovascular: Normal rate, regular rhythm, S1 normal, S2 normal, normal heart sounds and intact distal pulses.  PMI is not displaced.  Exam reveals no gallop and no friction rub.   No murmur heard. Pulmonary/Chest: Effort normal and breath sounds normal. No stridor. No respiratory distress. He has no decreased breath sounds. He has no wheezes. He has no rhonchi. He has no rales.  Negative egophany all fields  Abdominal: Soft. He exhibits no distension.  Musculoskeletal: Normal range of motion. He exhibits no edema or tenderness.       Right shoulder: Normal.       Left shoulder: Normal.       Right hip: Normal.       Left hip: Normal.       Right knee: Normal.       Left knee: Normal.        Cervical back: Normal.       Right hand: Normal.       Left hand: Normal.  Lymphadenopathy:       Head (right side): No submental, no submandibular, no tonsillar, no preauricular, no posterior auricular and no occipital adenopathy present.       Head (left side): No submental, no submandibular, no tonsillar, no preauricular, no posterior auricular and no occipital adenopathy present.    He has no cervical adenopathy.       Right cervical: No superficial cervical, no deep cervical and no posterior cervical adenopathy present.      Left cervical: No superficial cervical, no deep cervical and no posterior cervical adenopathy present.  Neurological: He is alert and oriented to person, place, and time. He displays no atrophy and no tremor. No cranial nerve deficit or sensory deficit. He exhibits normal muscle tone. He displays no seizure activity. Coordination and gait normal. GCS eye subscore is 4. GCS verbal subscore is 5. GCS motor subscore is 6.  Skin: Skin is warm, dry and intact. No abrasion, no bruising, no burn, no ecchymosis, no laceration, no lesion, no petechiae and no rash noted. He is not diaphoretic. No cyanosis or erythema. No pallor. Nails show no clubbing.  Psychiatric: He has a normal mood and affect. His speech is normal and behavior is normal. Judgment and thought content normal. Cognition and memory are normal.  Nursing note and vitals reviewed.   ED Course  Procedures (including critical care time)  Labs Review Labs Reviewed - No data to display  Imaging Review No results found.   1115 patient very argumentative with mother regarding medications and how to take them; compared how he takes them.  Discussed at length with patient and mother asthma/allergy  Treatment x 15 minutes face to face.  Able to speak in multiple sentences without shortness of breath or coughing.  No coughing with deep breathing. MDM   1. Asthma exacerbation attacks, mild persistent   2. Acute  rhinosinusitis   3. Otitis media with effusion, bilateral    Patient noncompliant with PCM medications exposed to sick contacts, weather change cold front--winter weather typically flares his asthma along with SAR season spring.  Medications as directed by PCM reiterated by not taking singulair 10mg  po daily, symbicort 2 puffs BID as prescribed asthma exacerbations more common with persistent asthma.  Repetitive oral steroids not good for his body.  Take zyrtec 10mg  po daily as prescribed also.  Albuterol MDI and nebulizer solution refilled as running low.  Patient is to return to the clinic or follow up with PCM if there is increased wheezing or shortness of breath, increased use of albuterol despite oral steroids prednisone 50mg  po daily x 5 daysl.  Patient educated on the importance of continuing to monitor their peak flows.  Patient given Exitcare handout on asthma, how to prevent asthma exacerbations.  Demonstrated MDI use with patient and mother.  Mother and Patient verbalized agreement and understanding of treatment plan.  P2:  Asthma action plan, fluids, and fitness  Prednisone 50mg  po daily,  restart daily asthma/allergy medications as prescribed.  If no improvement after 5 day course prednisone contact me with symptom update will consider antibiotic.  Bronchitis simple, community acquired, may have started as viral (probably respiratory syncytial, parainfluenza, influenza, or adenovirus), but now evidence of acute purulent bronchitis with resultant bronchial edema and mucus formation.  Viruses are the most common cause of bronchial inflammation in otherwise healthy adults with acute bronchitis.  The appearance of sputum is not predictive of whether a bacterial infection is present.  Purulent sputum is most often caused by viral infections.  There are a small portion of those caused by non-viral agents being Mycoplamsa pneumonia.  Microscopic examination or C&S of sputum in the healthy adult with acute  bronchitis is generally not helpful (usually negative or normal respiratory flora) other considerations being cough from upper respiratory tract infections, sinusitis or allergic syndromes (mild asthma or viral pneumonia).  Differential Diagnosis:  reactive airway disease (asthma, allergic aspergillosis (eosinophilia), chronic bronchitis, respiratory infection (Sinusitis, Common cold, pneumonia), congestive heart failure, reflux esophagitis, bronchogenic tumor, aspiration syndromes and/or exposure irritants/tobacco smoke.  In this case, there is no evidence of any invasive bacterial illness.  Most likely viral etiology so will hold on antibiotic treatment.  Advise supportive care with rest, encourage fluids, good hygiene and watch for any worsening symptoms.  If they were to develop:  come back to the office or go to the emergency room if after hours. Without high fever, severe dyspnea, lack of physical findings or other risk factors, I will hold on a chest radiograph and CBC at this time.  I discussed that approximately 50% of patients with acute bronchitis have a cough that lasts up to three weeks, and 25% for over a month.  Tylenol, one to two tablets every four hours as needed for fever or myalgias.   No aspirin. Mother and Patient instructed to follow up in one week or sooner if symptoms worsen. Patient verbalized agreement and understanding of treatment plan.  P2:  hand washing and cover cough  Supportive treatment.   No evidence of invasive bacterial infection, non toxic and well hydrated.  This is most likely self limiting viral infection.  I do not see where any further testing or imaging is necessary at this time.   I will suggest supportive care, rest, good hygiene and encourage the patient to take adequate fluids.  The patient is to return to clinic or EMERGENCY ROOM if symptoms worsen or change significantly e.g. ear pain, fever, purulent discharge from ears or bleeding.  Exitcare handout on otitis  media with effusion given to patient.  Patient verbalized agreement and understanding of treatment plan.    Flu still not common in community.  Suspect Viral illness: no evidence of invasive bacterial infection, non toxic and well hydrated.  This is most likely self limiting viral infection.  I do not see where any further testing or imaging is necessary at this time.   I will suggest supportive care, rest, good hygiene and encourage the patient to take adequate fluids.  Does not require work/school excuse.  Refused influenza testing. restart flonase 1 spray each nostril BID prn, nasal saline 1-2 sprays each nostril prn q2h, tylenol 1000mg  po QID prn and/ormotrin 800mg  po TID prn.  Discussed honey with lemon and salt water gargles for comfort also.  The patient is to return to clinic or EMERGENCY ROOM if symptoms worsen or change significantly e.g. fever, lethargy, SOB, wheezing.  Exitcare handout on viral illness given to  patient.  Patient verbalized agreement and understanding of treatment plan.    Restart flonase 1 spray each nostril bid, singulair  po daily and zyrtec  po daily. Nasal saline 2 sprays each nostril q2h prn congestion.  Shower BID, humidifier use in bedroom.  No evidence of systemic bacterial infection, non toxic and well hydrated.  I do not see where any further testing or imaging is necessary at this time.   I will suggest supportive care, rest, good hygiene and encourage the patient to take adequate fluids.  The patient is to return to clinic or EMERGENCY ROOM if symptoms worsen or change significantly.  Exitcare handout on sinusitis given to patient. Mother and Patient verbalized agreement and understanding of treatment plan and had no further questions at this time.   P2:  Hand washing and cover cough  Barbaraann Barthel, NP 09/07/15 2011

## 2015-09-27 HISTORY — PX: WISDOM TOOTH EXTRACTION: SHX21

## 2015-11-24 ENCOUNTER — Telehealth: Payer: Self-pay | Admitting: Family Medicine

## 2015-11-24 NOTE — Telephone Encounter (Signed)
I've seen him once. Ok by me if ok by Dr Patsy Lager. Would like to touch base with Dr Patsy Lager about him.

## 2015-11-24 NOTE — Telephone Encounter (Signed)
Patient's mother called to schedule physical for patient.  Patient's mother asked if patient can switch from Dr.Copland to Dr.Gutierrez.  Patient said he felt comfortable with Dr.Gutierrez.  Can patient switch to Dr.Gutierrez?

## 2015-11-24 NOTE — Telephone Encounter (Signed)
This would be ok with me. From my memory, he is a very nice young man. I am happy to discuss further if you would like.

## 2016-01-11 ENCOUNTER — Telehealth: Payer: Self-pay | Admitting: Family Medicine

## 2016-01-11 NOTE — Telephone Encounter (Signed)
Will speak with patient at office visit. Transfer of care from Dr Patsy [place]. I have seen him once.

## 2016-01-11 NOTE — Telephone Encounter (Signed)
Pt's mom calling:  States the patient says he does not feel well on a regular basis.  Patient looked up his symptoms online and thinks he could have Leukemia.  Pt's mom wants to know if more bloodwork could be drawn than just the normal CPE bloodwork.  Also, she requests STD testing.  Pt's mom is on the DPR. [phone] Elmer Sow(Yvette Williams- mom)

## 2016-01-17 ENCOUNTER — Other Ambulatory Visit: Payer: Self-pay | Admitting: Family Medicine

## 2016-01-19 ENCOUNTER — Other Ambulatory Visit: Payer: Federal, State, Local not specified - PPO

## 2016-01-20 ENCOUNTER — Other Ambulatory Visit: Payer: Federal, State, Local not specified - PPO

## 2016-01-25 ENCOUNTER — Encounter: Payer: Federal, State, Local not specified - PPO | Admitting: Family Medicine

## 2016-01-27 ENCOUNTER — Encounter: Payer: Self-pay | Admitting: Family Medicine

## 2016-01-27 ENCOUNTER — Ambulatory Visit (INDEPENDENT_AMBULATORY_CARE_PROVIDER_SITE_OTHER): Payer: Federal, State, Local not specified - PPO | Admitting: Family Medicine

## 2016-01-27 VITALS — BP 112/74 | HR 76 | Temp 97.5°F | Ht 76.0 in | Wt 204.5 lb

## 2016-01-27 DIAGNOSIS — Z0000 Encounter for general adult medical examination without abnormal findings: Secondary | ICD-10-CM | POA: Diagnosis not present

## 2016-01-27 DIAGNOSIS — J302 Other seasonal allergic rhinitis: Secondary | ICD-10-CM

## 2016-01-27 DIAGNOSIS — Z13220 Encounter for screening for lipoid disorders: Secondary | ICD-10-CM | POA: Diagnosis not present

## 2016-01-27 DIAGNOSIS — R634 Abnormal weight loss: Secondary | ICD-10-CM

## 2016-01-27 DIAGNOSIS — F4010 Social phobia, unspecified: Secondary | ICD-10-CM | POA: Insufficient documentation

## 2016-01-27 DIAGNOSIS — J4540 Moderate persistent asthma, uncomplicated: Secondary | ICD-10-CM

## 2016-01-27 DIAGNOSIS — Z114 Encounter for screening for human immunodeficiency virus [HIV]: Secondary | ICD-10-CM

## 2016-01-27 DIAGNOSIS — Z113 Encounter for screening for infections with a predominantly sexual mode of transmission: Secondary | ICD-10-CM

## 2016-01-27 LAB — COMPREHENSIVE METABOLIC PANEL
ALK PHOS: 86 U/L (ref 39–117)
ALT: 27 U/L (ref 0–53)
AST: 26 U/L (ref 0–37)
Albumin: 4.6 g/dL (ref 3.5–5.2)
BILIRUBIN TOTAL: 1.2 mg/dL (ref 0.2–1.2)
BUN: 15 mg/dL (ref 6–23)
CO2: 29 mEq/L (ref 19–32)
Calcium: 10.3 mg/dL (ref 8.4–10.5)
Chloride: 102 mEq/L (ref 96–112)
Creatinine, Ser: 0.99 mg/dL (ref 0.40–1.50)
GFR: 123.93 mL/min (ref >60.00)
Glucose, Bld: 87 mg/dL (ref 70–99)
POTASSIUM: 4.3 meq/L (ref 3.5–5.1)
SODIUM: 139 meq/L (ref 135–145)
TOTAL PROTEIN: 8.3 g/dL (ref 6.0–8.3)

## 2016-01-27 LAB — LIPID PANEL
CHOL/HDL RATIO: 2
Cholesterol: 133 mg/dL (ref 0–200)
HDL: 54.7 mg/dL (ref >39.00)
LDL Cholesterol: 70 mg/dL (ref 0–99)
NONHDL: 78.59
Triglycerides: 45 mg/dL (ref 0.0–149.0)
VLDL: 9 mg/dL (ref 0.0–40.0)

## 2016-01-27 LAB — CBC WITH DIFFERENTIAL/PLATELET
BASOS ABS: 0 10*3/uL (ref 0.0–0.1)
Basophils Relative: 0.5 % (ref 0.0–3.0)
EOS ABS: 0.4 10*3/uL (ref 0.0–0.7)
Eosinophils Relative: 9.6 % — ABNORMAL HIGH (ref 0.0–5.0)
HEMATOCRIT: 47.2 % (ref 39.0–52.0)
HEMOGLOBIN: 15.6 g/dL (ref 13.0–17.0)
LYMPHS PCT: 37.6 % (ref 12.0–46.0)
Lymphs Abs: 1.6 10*3/uL (ref 0.7–4.0)
MCHC: 33.1 g/dL (ref 30.0–36.0)
MCV: 82.1 fl (ref 78.0–100.0)
MONOS PCT: 9.3 % (ref 3.0–12.0)
Monocytes Absolute: 0.4 10*3/uL (ref 0.1–1.0)
Neutro Abs: 1.8 10*3/uL (ref 1.4–7.7)
Neutrophils Relative %: 43 % (ref 43.0–77.0)
PLATELETS: 236 10*3/uL (ref 150.0–400.0)
RBC: 5.76 Mil/uL (ref 4.22–5.81)
RDW: 14.1 % (ref 11.5–14.6)
WBC: 4.2 10*3/uL — AB (ref 4.5–10.5)

## 2016-01-27 LAB — TSH: TSH: 5.06 u[IU]/mL (ref 0.35–5.50)

## 2016-01-27 MED ORDER — MONTELUKAST SODIUM 10 MG PO TABS: 10.0000 mg | ORAL_TABLET | Freq: Every day | ORAL | Status: DC

## 2016-01-27 MED ORDER — LEVALBUTEROL HCL 1.25 MG/3ML IN NEBU: 1.2500 mg | INHALATION_SOLUTION | Freq: Four times a day (QID) | RESPIRATORY_TRACT | Status: DC | PRN

## 2016-01-27 MED ORDER — BUDESONIDE-FORMOTEROL FUMARATE 80-4.5 MCG/ACT IN AERO: 2.0000 | INHALATION_SPRAY | Freq: Two times a day (BID) | RESPIRATORY_TRACT | Status: DC

## 2016-01-27 MED ORDER — ALBUTEROL SULFATE HFA 108 (90 BASE) MCG/ACT IN AERS: 2.0000 | INHALATION_SPRAY | RESPIRATORY_TRACT | Status: DC | PRN

## 2016-01-27 NOTE — Progress Notes (Signed)
BP 112/74 mmHg  Pulse 76  Temp(Src) 97.5 F (36.4 C) (Oral)  Ht  (1.93 m)  Wt 204 lb 8 oz (92.761 kg)  BMI 24.90 kg/m2   CC: CPE  Subjective:    Patient ID: Jordan Ferguson, male    DOB: 06/13/1996, 20 y.o.   MRN: [id number]  HPI: Jordan Ferguson is a 20 y.o. male presenting on 01/27/2016 for Annual Exam   New pt to establish, prior saw Dr Patsy Lager. Requested to transfer care to me. I saw him once prior 11/2014. Mom wants him evaluated for leukemia and STD screening. Fasting today.   10lb weight loss in last 6 month.working out. 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 Appetite good, eating well 3 meals a day with snacks in between. Still no noted weight gain.   H/o seizures, none in years. Off antiepileptic therapy for years.  Asthma - uses prn xopenex neb, alb inh, and symbicort. Not regular with controller med. Last xopenex neb this week. No nocturnal awakenings. Triggers are cigarette smoke, mildew, pollen.   Had wisdom teeth taken out 2 wks ago.   Denies rec drugs, smoking, EtOh. 1 partner in last year - monogamous relationship with GF.   Lives with mother and brother, 1 dog and rabbit Edu: finished at [employer] Occ: acting, drummer Activity: lifts weights at gym twice weekly  Diet: good water, fruits/vegetables daily  Relevant past medical, surgical, family and social history reviewed and updated as indicated. Interim medical history since our last visit reviewed. Allergies and medications reviewed and updated. Current Outpatient Prescriptions on File Prior to Visit  Medication Sig  . Cetirizine HCl (ZYRTEC ALLERGY) 10 MG CAPS Take 1 capsule (10 mg total) by mouth daily.  . fluticasone (FLONASE) 50 MCG/ACT nasal spray Place 1 spray into both nostrils daily.  . sodium chloride (OCEAN) 0.65 % SOLN nasal spray Place 2 sprays into both nostrils every 2 (two) hours while awake. (Patient not taking: Reported on 01/27/2016)   No current  facility-administered medications on file prior to visit.    Review of Systems  Constitutional: Negative for fever, chills, activity change, appetite change, fatigue and unexpected weight change.  HENT: Negative for hearing loss.        Wisdom teeth recently removed  Eyes: Negative for visual disturbance.  Respiratory: Positive for cough and shortness of breath (with asthma). Negative for chest tightness and wheezing.   Cardiovascular: Negative for chest pain, palpitations and leg swelling.  Gastrointestinal: Negative for nausea, vomiting, abdominal pain, diarrhea, constipation, blood in stool and abdominal distention.  Genitourinary: Negative for hematuria and difficulty urinating.  Musculoskeletal: Negative for myalgias, arthralgias and neck pain.       No bone pain  Skin: Negative for rash.  Neurological: Negative for dizziness, seizures, syncope and headaches.  Hematological: Negative for adenopathy. Does not bruise/bleed easily.  Psychiatric/Behavioral: Negative for dysphoric mood. The patient is nervous/anxious (some social anxiety, trouble with crowds).    Per HPI unless specifically indicated in ROS section     Objective:    BP 112/74 mmHg  Pulse 76  Temp(Src) 97.5 F (36.4 C) (Oral)  Ht  (1.93 m)  Wt 204 lb 8 oz (92.761 kg)  BMI 24.90 kg/m2  Wt Readings from Last 3 Encounters:  01/27/16 204 lb 8 oz (92.761 kg)  07/18/15 214 lb (97.07 kg) (96 %*, Z = 1.76)  06/15/15 211 lb (95.709 kg) (96 %*, Z = 1.70)   *  Growth percentiles are based on CDC 2-20 Years data.    Physical Exam  Constitutional: He is oriented to person, place, and time. He appears well-developed and well-nourished. No distress.  HENT:  Head: Normocephalic and atraumatic.  Right Ear: Hearing, tympanic membrane, external ear and ear canal normal.  Left Ear: Hearing, tympanic membrane, external ear and ear canal normal.  Nose: Nose normal.  Mouth/Throat: Uvula is midline, oropharynx is clear and  moist and mucous membranes are normal. No oropharyngeal exudate, posterior oropharyngeal edema or posterior oropharyngeal erythema.  Eyes: Conjunctivae and EOM are normal. Pupils are equal, round, and reactive to light. No scleral icterus.  Neck: Normal range of motion. Neck supple. No thyromegaly present.  Cardiovascular: Normal rate, regular rhythm, normal heart sounds and intact distal pulses.   No murmur heard. Pulses:      Radial pulses are 2+ on the right side, and 2+ on the left side.  Pulmonary/Chest: Effort normal and breath sounds normal. No respiratory distress. He has no wheezes. He has no rales.  Abdominal: Soft. Bowel sounds are normal. He exhibits no distension and no mass. There is no tenderness. There is no rebound and no guarding.  Musculoskeletal: Normal range of motion. He exhibits no edema.  Lymphadenopathy:    He has no cervical adenopathy.  Neurological: He is alert and oriented to person, place, and time.  CN grossly intact, station and gait intact  Skin: Skin is warm and dry. No rash noted.  Psychiatric: He has a normal mood and affect. His behavior is normal. Judgment and thought content normal.  Nursing note and vitals reviewed.     Assessment & Plan:   Problem List Items Addressed This Visit    Asthma, moderate persistent, poorly-controlled    Discussed regular symbicort use 2 puffs BID. Advised RTC 6 mo asthma f/u. Very infrequent rescue med use recently.       Relevant Medications   albuterol (VENTOLIN HFA) 108 (90 Base) MCG/ACT inhaler   levalbuterol (XOPENEX) 1.25 MG/3ML nebulizer solution   montelukast (SINGULAIR) 10 MG tablet   budesonide-formoterol (SYMBICORT) 80-4.5 MCG/ACT inhaler   Seasonal allergic rhinitis    PRN zyrtec, flonase, singulair.      Health maintenance examination - Primary    Preventative protocols reviewed and updated unless pt declined. Discussed healthy diet and lifestyle.       Loss of weight    Pt and mom worried about  noted weight loss and inability to keep weight on - although he does endorse increased activity recently.  Anticipate fast metabolism but will check baseline labs (CBC, CMP, TSH). Pt agrees with plan.      Relevant Orders   Comprehensive metabolic panel   TSH   CBC with Differential/Platelet   Social anxiety disorder    Support provided today. If overwhelming, pt to notify me and consider counseling. Does better when putting on persona (playing in band, acting in woods of terror, etc) PHQ9 = 11/27 GAD7 = 13/21       Other Visit Diagnoses    Lipid screening        Relevant Orders    Lipid panel    Screen for STD (sexually transmitted disease)        Relevant Orders    GC/chlamydia probe amp, urine    RPR    Screening for HIV (human immunodeficiency virus)        Relevant Orders    HIV antibody        Follow up  plan: Return in about 6 months (around 07/29/2016), or as needed, for follow up visit.  Eustaquio BoydenJavier Aaliyana Fredericks, MD

## 2016-01-27 NOTE — Addendum Note (Signed)
Addended by: Baldomero LamyHAVERS, NATASHA C on: 01/27/2016 04:30 PM   Modules accepted: Kipp BroodSmartSet

## 2016-01-27 NOTE — Assessment & Plan Note (Signed)
Preventative protocols reviewed and updated unless pt declined. Discussed healthy diet and lifestyle.  

## 2016-01-27 NOTE — Addendum Note (Signed)
Addended by: Liane ComberHAVERS, NATASHA C on: 01/27/2016 11:14 AM   Modules accepted: Orders, SmartSet

## 2016-01-27 NOTE — Assessment & Plan Note (Addendum)
Support provided today. If overwhelming, pt to notify me and consider counseling. Does better when putting on persona (playing in band, acting in woods of terror, etc) PHQ9 = 11/27 GAD7 = 13/21

## 2016-01-27 NOTE — Assessment & Plan Note (Signed)
Discussed regular symbicort use 2 puffs BID. Advised RTC 6 mo asthma f/u. Very infrequent rescue med use recently.

## 2016-01-27 NOTE — Progress Notes (Signed)
Pre visit review using our clinic review tool, if applicable. No additional management support is needed unless otherwise documented below in the visit note. 

## 2016-01-27 NOTE — Patient Instructions (Addendum)
Be more regular with symbicort.  More water. Fruits/vegetables daily.  Labs today.  Return as needed or in 6 months for follow up visit asthma.   Health Maintenance, Male A healthy lifestyle and preventative care can promote health and wellness.  Maintain regular health, dental, and eye exams.  Eat a healthy diet. Foods like vegetables, fruits, whole grains, low-fat dairy products, and lean protein foods contain the nutrients you need and are low in calories. Decrease your intake of foods high in solid fats, added sugars, and salt. Get information about a proper diet from your health care provider, if necessary.  Regular physical exercise is one of the most important things you can do for your health. Most adults should get at least 150 minutes of moderate-intensity exercise (any activity that increases your heart rate and causes you to sweat) each week. In addition, most adults need muscle-strengthening exercises on 2 or more days a week.   Maintain a healthy weight. The body mass index (BMI) is a screening tool to identify possible weight problems. It provides an estimate of body fat based on height and weight. Your health care provider can find your BMI and can help you achieve or maintain a healthy weight. For males 20 years and older:  A BMI below 18.5 is considered underweight.  A BMI of 18.5 to 24.9 is normal.  A BMI of 25 to 29.9 is considered overweight.  A BMI of 30 and above is considered obese.  Maintain normal blood lipids and cholesterol by exercising and minimizing your intake of saturated fat. Eat a balanced diet with plenty of fruits and vegetables. Blood tests for lipids and cholesterol should begin at age 20 and be repeated every 5 years. If your lipid or cholesterol levels are high, you are over age 850, or you are at high risk for heart disease, you may need your cholesterol levels checked more frequently.Ongoing high lipid and cholesterol levels should be treated with  medicines if diet and exercise are not working.  If you smoke, find out from your health care provider how to quit. If you do not use tobacco, do not start.  Lung cancer screening is recommended for adults aged 55-80 years who are at high risk for developing lung cancer because of a history of smoking. A yearly low-dose CT scan of the lungs is recommended for people who have at least a 30-pack-year history of smoking and are current smokers or have quit within the past 15 years. A pack year of smoking is smoking an average of 1 pack of cigarettes a day for 1 year (for example, a 30-pack-year history of smoking could mean smoking 1 pack a day for 30 years or 2 packs a day for 15 years). Yearly screening should continue until the smoker has stopped smoking for at least 15 years. Yearly screening should be stopped for people who develop a health problem that would prevent them from having lung cancer treatment.  If you choose to drink alcohol, do not have more than 2 drinks per day. One drink is considered to be 12 oz (360 mL) of beer, 5 oz (150 mL) of wine, or 1.5 oz (45 mL) of liquor.  Avoid the use of street drugs. Do not share needles with anyone. Ask for help if you need support or instructions about stopping the use of drugs.  High blood pressure causes heart disease and increases the risk of stroke. High blood pressure is more likely to develop in:  People  who have blood pressure in the end of the normal range (100-139/85-89 mm Hg).  People who are overweight or obese.  People who are African American.  If you are 66-32 years of age, have your blood pressure checked every 3-5 years. If you are 67 years of age or older, have your blood pressure checked every year. You should have your blood pressure measured twice--once when you are at a hospital or clinic, and once when you are not at a hospital or clinic. Record the average of the two measurements. To check your blood pressure when you are not  at a hospital or clinic, you can use:  An automated blood pressure machine at a pharmacy.  A home blood pressure monitor.  If you are 59-5 years old, ask your health care provider if you should take aspirin to prevent heart disease.  Diabetes screening involves taking a blood sample to check your fasting blood sugar level. This should be done once every 3 years after age 68 if you are at a normal weight and without risk factors for diabetes. Testing should be considered at a younger age or be carried out more frequently if you are overweight and have at least 1 risk factor for diabetes.  Colorectal cancer can be detected and often prevented. Most routine colorectal cancer screening begins at the age of 72 and continues through age 34. However, your health care provider may recommend screening at an earlier age if you have risk factors for colon cancer. On a yearly basis, your health care provider may provide home test kits to check for hidden blood in the stool. A small camera at the end of a tube may be used to directly examine the colon (sigmoidoscopy or colonoscopy) to detect the earliest forms of colorectal cancer. Talk to your health care provider about this at age 78 when routine screening begins. A direct exam of the colon should be repeated every 5-10 years through age 50, unless early forms of precancerous polyps or small growths are found.  People who are at an increased risk for hepatitis B should be screened for this virus. You are considered at high risk for hepatitis B if:  You were born in a country where hepatitis B occurs often. Talk with your health care provider about which countries are considered high risk.  Your parents were born in a high-risk country and you have not received a shot to protect against hepatitis B (hepatitis B vaccine).  You have HIV or AIDS.  You use needles to inject street drugs.  You live with, or have sex with, someone who has hepatitis B.  You  are a man who has sex with other men (MSM).  You get hemodialysis treatment.  You take certain medicines for conditions like cancer, organ transplantation, and autoimmune conditions.  Hepatitis C blood testing is recommended for all people born from [birth year] through 1965 and any individual with known risk factors for hepatitis C.  Healthy men should no longer receive prostate-specific antigen (PSA) blood tests as part of routine cancer screening. Talk to your health care provider about prostate cancer screening.  Testicular cancer screening is not recommended for adolescents or adult males who have no symptoms. Screening includes self-exam, a health care provider exam, and other screening tests. Consult with your health care provider about any symptoms you have or any concerns you have about testicular cancer.  Practice safe sex. Use condoms and avoid high-risk sexual practices to reduce the spread of sexually  transmitted infections (STIs).  You should be screened for STIs, including gonorrhea and chlamydia if:  You are sexually active and are younger than 24 years.  You are older than 24 years, and your health care provider tells you that you are at risk for this type of infection.  Your sexual activity has changed since you were last screened, and you are at an increased risk for chlamydia or gonorrhea. Ask your health care provider if you are at risk.  If you are at risk of being infected with HIV, it is recommended that you take a prescription medicine daily to prevent HIV infection. This is called pre-exposure prophylaxis (PrEP). You are considered at risk if:  You are a man who has sex with other men (MSM).  You are a heterosexual man who is sexually active with multiple partners.  You take drugs by injection.  You are sexually active with a partner who has HIV.  Talk with your health care provider about whether you are at high risk of being infected with HIV. If you choose to begin  PrEP, you should first be tested for HIV. You should then be tested every 3 months for as long as you are taking PrEP.  Use sunscreen. Apply sunscreen liberally and repeatedly throughout the day. You should seek shade when your shadow is shorter than you. Protect yourself by wearing long sleeves, pants, a wide-brimmed hat, and sunglasses year round whenever you are outdoors.  Tell your health care provider of new moles or changes in moles, especially if there is a change in shape or color. Also, tell your health care provider if a mole is larger than the size of a pencil eraser.  A one-time screening for abdominal aortic aneurysm (AAA) and surgical repair of large AAAs by ultrasound is recommended for men aged 8-75 years who are current or former smokers.  Stay current with your vaccines (immunizations).   This information is not intended to replace advice given to you by your health care provider. Make sure you discuss any questions you have with your health care provider.   Document Released: 03/10/2008 Document Revised: 10/03/2014 Document Reviewed: 02/07/2011 Elsevier Interactive Patient Education [employer].

## 2016-01-27 NOTE — Assessment & Plan Note (Signed)
PRN zyrtec, flonase, singulair.

## 2016-01-27 NOTE — Assessment & Plan Note (Signed)
Pt and mom worried about noted weight loss and inability to keep weight on - although he does endorse increased activity recently.  Anticipate fast metabolism but will check baseline labs (CBC, CMP, TSH). Pt agrees with plan.

## 2016-01-28 ENCOUNTER — Encounter: Payer: Self-pay | Admitting: *Deleted

## 2016-01-28 LAB — RPR

## 2016-01-28 LAB — GC/CHLAMYDIA PROBE AMP
CT PROBE, AMP APTIMA: NOT DETECTED
GC Probe RNA: NOT DETECTED

## 2016-01-28 LAB — HIV ANTIBODY (ROUTINE TESTING W REFLEX): HIV: NONREACTIVE

## 2016-02-12 ENCOUNTER — Ambulatory Visit
Admission: EM | Admit: 2016-02-12 | Discharge: 2016-02-12 | Disposition: A | Discharge status: Home / Self Care | Payer: Federal, State, Local not specified - PPO | Attending: Family Medicine | Admitting: Family Medicine

## 2016-02-12 ENCOUNTER — Encounter: Payer: Self-pay | Admitting: Emergency Medicine

## 2016-02-12 DIAGNOSIS — K219 Gastro-esophageal reflux disease without esophagitis: Secondary | ICD-10-CM | POA: Diagnosis not present

## 2016-02-12 HISTORY — DX: Gastro-esophageal reflux disease without esophagitis: K21.9

## 2016-02-12 HISTORY — DX: Reserved for inherently not codable concepts without codable children: IMO0001

## 2016-02-12 NOTE — ED Notes (Signed)
Chest pain, acid reflux, burping that burns, coughing for 3 days

## 2016-03-14 NOTE — ED Provider Notes (Signed)
CSN: [id number]     Arrival date & time 02/12/16  1759 History   First MD Initiated Contact with Patient 02/12/16 1842     Chief Complaint  Patient presents with  . Chest Pain  . Gastroesophageal Reflux   (Consider location/radiation/quality/duration/timing/severity/associated sxs/prior Treatment) HPI Comments: 20 yo male with 3 days h/o chest burning pain, burping that burns and intermittent coughing. Denies any fevers, chills, shortness of breath. States has h/o acid reflux, heartburn.   Patient is a 20 y.o. male presenting with chest pain and GERD. The history is provided by the patient.  Chest Pain Gastroesophageal Reflux Associated symptoms include chest pain.    Past Medical History  Diagnosis Date  . Asthma   . Seizures (HCC)     None in many years, no meds for years  . Reflux    Past Surgical History  Procedure Laterality Date  . Wisdom tooth extraction  2017  . Dental surgery    . Concealed penis repair     Family History  Problem Relation Age of Onset  . Cancer Maternal Aunt 50    lung (smoker)  . CAD Neg Hx   . Stroke Maternal Uncle 80  . Diabetes Neg Hx    Social History  Substance Use Topics  . Smoking status: Never Smoker   . Smokeless tobacco: Never Used  . Alcohol Use: No    Review of Systems  Cardiovascular: Positive for chest pain.    Allergies  Amoxicillin and Lamictal  Home Medications   Prior to Admission medications   Medication Sig Start Date End Date Taking? Authorizing Provider  albuterol (VENTOLIN HFA) 108 (90 Base) MCG/ACT inhaler Inhale 2 puffs into the lungs every 4 (four) hours as needed for wheezing or shortness of breath. 01/27/16 06/18/17  Eustaquio Boyden, MD  budesonide-formoterol (SYMBICORT) 80-4.5 MCG/ACT inhaler Inhale 2 puffs into the lungs 2 (two) times daily. 01/27/16   Eustaquio Boyden, MD  Cetirizine HCl (ZYRTEC ALLERGY) 10 MG CAPS Take 1 capsule (10 mg total) by mouth daily. 11/26/14   Eustaquio Boyden, MD  famotidine  (PEPCID) 20 MG tablet Take 20 mg by mouth daily as needed for heartburn or indigestion.    Historical Provider, MD  fluticasone (FLONASE) 50 MCG/ACT nasal spray Place 1 spray into both nostrils daily. 09/06/15 01/26/17  Barbaraann Barthel, NP  ibuprofen (ADVIL,MOTRIN) 600 MG tablet Take 600 mg by mouth every 6 (six) hours as needed.    Historical Provider, MD  levalbuterol Pauline Aus) 1.25 MG/3ML nebulizer solution Take 1.25 mg by nebulization every 6 (six) hours as needed for wheezing or shortness of breath. 01/27/16 06/18/17  Eustaquio Boyden, MD  montelukast (SINGULAIR) 10 MG tablet Take 1 tablet (10 mg total) by mouth at bedtime. 01/27/16 06/18/17  Eustaquio Boyden, MD  sodium chloride (OCEAN) 0.65 % SOLN nasal spray Place 2 sprays into both nostrils every 2 (two) hours while awake. Patient not taking: Reported on 01/27/2016 09/06/15   Barbaraann Barthel, NP   Meds Ordered and Administered this Visit  Medications - No data to display  BP 122/62 mmHg  Pulse 84  Temp(Src) 97.6 F (36.4 C) (Tympanic)  Resp 18  Ht  (1.93 m)  Wt 212 lb (96.163 kg)  BMI 25.82 kg/m2  SpO2 99% No data found.   Physical Exam  Constitutional: He appears well-developed and well-nourished. No distress.  HENT:  Head: Normocephalic and atraumatic.  Right Ear: Tympanic membrane, external ear and ear canal normal.  Left Ear: Tympanic membrane,  external ear and ear canal normal.  Nose: Nose normal.  Mouth/Throat: Uvula is midline, oropharynx is clear and moist and mucous membranes are normal. No oropharyngeal exudate or tonsillar abscesses.  Eyes: Conjunctivae and EOM are normal. Pupils are equal, round, and reactive to light. Right eye exhibits no discharge. Left eye exhibits no discharge. No scleral icterus.  Neck: Normal range of motion. Neck supple. No tracheal deviation present. No thyromegaly present.  Cardiovascular: Normal rate, regular rhythm and normal heart sounds.   Pulmonary/Chest: Effort normal and breath  sounds normal. No stridor. No respiratory distress. He has no wheezes. He has no rales. He exhibits no tenderness.  Abdominal: Soft. Bowel sounds are normal. He exhibits no distension and no mass. There is no tenderness. There is no rebound and no guarding.  Lymphadenopathy:    He has no cervical adenopathy.  Neurological: He is alert.  Skin: Skin is warm and dry. No rash noted. He is not diaphoretic.  Nursing note and vitals reviewed.   ED Course  Procedures (including critical care time)  Labs Review Labs Reviewed - No data to display  Imaging Review No results found.   Visual Acuity Review  Right Eye Distance:   Left Eye Distance:   Bilateral Distance:    Right Eye Near:   Left Eye Near:    Bilateral Near:         MDM   1. Gastroesophageal reflux disease, esophagitis presence not specified     Discharge Medication List as of 02/12/2016  7:21 PM     1. diagnosis reviewed with patient 2. Recommend patient increase his H2 blocker otc medication 3. Follow-up prn if symptoms worsen or don't improve    Payton Mccallumrlando Zyen Triggs, MD 03/14/16 1610

## 2016-09-09 DIAGNOSIS — Z09 Encounter for follow-up examination after completed treatment for conditions other than malignant neoplasm: Secondary | ICD-10-CM | POA: Diagnosis not present

## 2016-09-09 DIAGNOSIS — J4520 Mild intermittent asthma, uncomplicated: Secondary | ICD-10-CM | POA: Diagnosis not present

## 2017-01-26 ENCOUNTER — Other Ambulatory Visit: Payer: Self-pay | Admitting: Family Medicine

## 2017-01-26 NOTE — Telephone Encounter (Signed)
Pt needs CPX scheduled after 01/26/17.  I LMTRC on voicemail. Refill was sent.

## 2017-02-27 ENCOUNTER — Encounter: Payer: Federal, State, Local not specified - PPO | Admitting: Family Medicine

## 2017-02-27 DIAGNOSIS — Z0289 Encounter for other administrative examinations: Secondary | ICD-10-CM

## 2017-02-28 ENCOUNTER — Telehealth: Payer: Self-pay | Admitting: Family Medicine

## 2017-02-28 NOTE — Telephone Encounter (Signed)
No f/u at this time. Would charge NSF.

## 2017-02-28 NOTE — Telephone Encounter (Signed)
Patient did not come in for their appointment on 02/27/17 for cpe. Please let me know if patient needs to be contacted immediately for follow up or no follow up needed. Do you want to charge the NSF?

## 2017-08-26 ENCOUNTER — Encounter: Payer: Self-pay | Admitting: Gynecology

## 2017-08-26 ENCOUNTER — Other Ambulatory Visit: Payer: Self-pay

## 2017-08-26 ENCOUNTER — Ambulatory Visit
Admission: EM | Admit: 2017-08-26 | Discharge: 2017-08-26 | Disposition: A | Discharge status: Home / Self Care | Payer: Federal, State, Local not specified - PPO | Attending: Family Medicine | Admitting: Family Medicine

## 2017-08-26 DIAGNOSIS — J029 Acute pharyngitis, unspecified: Secondary | ICD-10-CM | POA: Diagnosis not present

## 2017-08-26 LAB — RAPID STREP SCREEN (MED CTR MEBANE ONLY): Streptococcus, Group A Screen (Direct): NEGATIVE

## 2017-08-26 MED ORDER — CLINDAMYCIN HCL 150 MG PO CAPS: 300.0000 mg | ORAL_CAPSULE | Freq: Four times a day (QID) | ORAL | 0 refills | Status: AC

## 2017-08-26 NOTE — ED Provider Notes (Signed)
[facility]    CSN: [id number] Arrival date & time: 08/26/17  1554     History   Chief Complaint Chief Complaint  Patient presents with  . Sore Throat    HPI Jordan Ferguson is a 21 y.o. male presents to the urgent care facility for evaluation of sore throat.  Sore throat has for several days.  Had some fevers, body aches and headache.  Very mild cough but no congestion or runny nose.  He has taken some over-the-counter medications without much improvement.  Patient is tolerating p.o. Well.   HPI  Past Medical History:  Diagnosis Date  . Asthma   . Reflux   . Seizures (HCC)    None in many years, no meds for years    Patient Active Problem List   Diagnosis Date Noted  . Health maintenance examination 01/27/2016  . Loss of weight 01/27/2016  . Social anxiety disorder 01/27/2016  . Epididymal cyst 11/26/2014  . Seasonal allergic rhinitis 11/26/2014  . Asthma, moderate persistent, poorly-controlled     Past Surgical History:  Procedure Laterality Date  . CONCEALED PENIS REPAIR    . DENTAL SURGERY    . WISDOM TOOTH EXTRACTION  2017       Home Medications    Prior to Admission medications   Medication Sig Start Date End Date Taking? Authorizing Provider  budesonide-formoterol (SYMBICORT) 80-4.5 MCG/ACT inhaler Inhale 2 puffs into the lungs 2 (two) times daily. 01/27/16  Yes Eustaquio BoydenGutierrez, Javier, MD  Cetirizine HCl (ZYRTEC ALLERGY) 10 MG CAPS Take 1 capsule (10 mg total) by mouth daily. 11/26/14  Yes Eustaquio BoydenGutierrez, Javier, MD  famotidine (PEPCID) 20 MG tablet Take 20 mg by mouth daily as needed for heartburn or indigestion.   Yes [provider]  ibuprofen (ADVIL,MOTRIN) 600 MG tablet Take 600 mg by mouth every 6 (six) hours as needed.   Yes [provider]  levalbuterol (XOPENEX) 1.25 MG/3ML nebulizer solution Take 1.25 mg by nebulization every 6 (six) hours as needed for wheezing or shortness of breath. 01/27/16 08/26/17 Yes Eustaquio BoydenGutierrez,  Javier, MD  montelukast (SINGULAIR) 10 MG tablet Take 1 tablet (10 mg total) by mouth at bedtime. 01/27/16 08/26/17 Yes Eustaquio BoydenGutierrez, Javier, MD  sodium chloride (OCEAN) 0.65 % SOLN nasal spray Place 2 sprays into both nostrils every 2 (two) hours while awake. 09/06/15  Yes Betancourt, Jarold Songina A, NP  clindamycin (CLEOCIN) 150 MG capsule Take 2 capsules (300 mg total) by mouth 4 (four) times daily for 10 days. 08/26/17 09/05/17  Evon SlackGaines, Darah Simkin C, PA-C  fluticasone (FLONASE) 50 MCG/ACT nasal spray Place 1 spray into both nostrils daily. 09/06/15 01/26/17  Betancourt, Jarold Songina A, NP  VENTOLIN HFA 108 (90 Base) MCG/ACT inhaler INHALE 2 PUFFS INTO THE LUNGS EVERY 4 (FOUR) HOURS AS NEEDED FOR WHEEZING OR SHORTNESS OF BREATH. 01/26/17   Eustaquio BoydenGutierrez, Javier, MD    Family History Family History  Problem Relation Age of Onset  . Cancer Maternal Aunt 50       lung (smoker)  . Stroke Maternal Uncle 80  . CAD Neg Hx   . Diabetes Neg Hx     Social History Social History   Tobacco Use  . Smoking status: Never Smoker  . Smokeless tobacco: Never Used  Substance Use Topics  . Alcohol use: No    Alcohol/week: 0.0 oz  . Drug use: No     Allergies   Amoxicillin and Lamictal [lamotrigine]   Review of Systems Review of Systems  Constitutional: Positive for  fever.  HENT: Positive for sore throat. Negative for congestion, trouble swallowing and voice change.   Respiratory: Positive for cough (mild dry). Negative for shortness of breath.   Cardiovascular: Negative for chest pain.  Gastrointestinal: Negative for abdominal pain.  Genitourinary: Negative for difficulty urinating, dysuria and urgency.  Musculoskeletal: Negative for back pain and myalgias.  Skin: Negative for rash.  Neurological: Positive for headaches. Negative for dizziness.     Physical Exam Triage Vital Signs ED Triage Vitals  Enc Vitals Group     BP 08/26/17 1605 139/80     Pulse Rate 08/26/17 1605 96     Resp 08/26/17 1605 16     Temp  08/26/17 1605 98.1 F (36.7 C)     Temp Source 08/26/17 1605 Oral     SpO2 08/26/17 1605 99 %     Weight 08/26/17 1604 195 lb (88.5 kg)     Height 08/26/17 1604 6\' 5"  (1.956 m)     Head Circumference --      Peak Flow --      Pain Score 08/26/17 1604 5     Pain Loc --      Pain Edu? --      Excl. in GC? --    No data found.  Updated Vital Signs BP 139/80 (BP Location: Right Arm)   Pulse 96   Temp 98.1 F (36.7 C) (Oral)   Resp 16   Ht 6\' 5"  (1.956 m)   Wt 195 lb (88.5 kg)   SpO2 99%   BMI 23.12 kg/m   Visual Acuity Right Eye Distance:   Left Eye Distance:   Bilateral Distance:    Right Eye Near:   Left Eye Near:    Bilateral Near:     Physical Exam  Constitutional: He is oriented to person, place, and time. He appears well-developed and well-nourished.  HENT:  Head: Normocephalic and atraumatic.  Mouth/Throat: Oropharyngeal exudate present.  Bilateral tonsillar erythema with exudates.  Uvula is midline with no signs of peritonsillar abscess.  Eyes: Conjunctivae are normal.  Neck: Normal range of motion.  Cardiovascular: Normal rate.  Pulmonary/Chest: Effort normal. No respiratory distress.  Abdominal: Soft. He exhibits no mass. There is no tenderness.  No LUQ tenderness  Musculoskeletal: Normal range of motion.  Lymphadenopathy:    He has cervical adenopathy (.  Anterior cervical lymphadenopathy tender).  Neurological: He is alert and oriented to person, place, and time.  Skin: Skin is warm. No rash noted.  Psychiatric: He has a normal mood and affect. His behavior is normal. Thought content normal.     UC Treatments / Results  Labs (all labs ordered are listed, but only abnormal results are displayed) Labs Reviewed  RAPID STREP SCREEN (NOT AT [facility])  CULTURE, GROUP A STREP [facility])    EKG  EKG Interpretation None       Radiology No results found.  Procedures Procedures (including critical care time)  Medications Ordered in UC Medications -  No data to display   Initial Impression / Assessment and Plan / UC Course  I have reviewed the triage vital signs and the nursing notes.  Pertinent labs & imaging results that were available during my care of the patient were reviewed by me and considered in my medical decision making (see chart for details).     A 21 year old male with pharyngitis consistent with strep.  Rapid strep test is negative.  Will treat with clindamycin to do clinical exam.  He is also  educated on other causes such as viral illness.  He is educated on signs and symptoms return to clinic for.  Final Clinical Impressions(s) / UC Diagnoses   Final diagnoses:  Pharyngitis, unspecified etiology    ED Discharge Orders        Ordered    clindamycin (CLEOCIN) 150 MG capsule  4 times daily     08/26/17 1612        Evon SlackGaines, Ernest Orr C, [place]PA-C 08/26/17 1616

## 2017-08-26 NOTE — Discharge Instructions (Signed)
Please alternate Tylenol and ibuprofen as needed for sore throat, fevers and body aches.  Make sure you are drinking lots of fluids.  Take antibiotics as prescribed.  Return to the clinic for any increasing pain worsening symptoms, difficulty swallowing or any urgent changes in your health.

## 2017-08-28 LAB — CULTURE, GROUP A STREP (THRC)

## 2017-12-26 ENCOUNTER — Other Ambulatory Visit: Payer: Self-pay | Admitting: Family Medicine

## 2017-12-26 DIAGNOSIS — R634 Abnormal weight loss: Secondary | ICD-10-CM

## 2017-12-26 DIAGNOSIS — Z113 Encounter for screening for infections with a predominantly sexual mode of transmission: Secondary | ICD-10-CM

## 2017-12-27 ENCOUNTER — Other Ambulatory Visit: Payer: Federal, State, Local not specified - PPO

## 2018-01-03 ENCOUNTER — Ambulatory Visit (INDEPENDENT_AMBULATORY_CARE_PROVIDER_SITE_OTHER): Payer: Federal, State, Local not specified - PPO | Admitting: Family Medicine

## 2018-01-03 ENCOUNTER — Encounter: Payer: Self-pay | Admitting: Family Medicine

## 2018-01-03 VITALS — BP 124/82 | HR 88 | Temp 98.2°F | Ht 76.0 in | Wt 219.5 lb

## 2018-01-03 DIAGNOSIS — Z113 Encounter for screening for infections with a predominantly sexual mode of transmission: Secondary | ICD-10-CM

## 2018-01-03 DIAGNOSIS — D72819 Decreased white blood cell count, unspecified: Secondary | ICD-10-CM

## 2018-01-03 DIAGNOSIS — J4540 Moderate persistent asthma, uncomplicated: Secondary | ICD-10-CM

## 2018-01-03 DIAGNOSIS — Z0000 Encounter for general adult medical examination without abnormal findings: Secondary | ICD-10-CM

## 2018-01-03 LAB — CBC WITH DIFFERENTIAL/PLATELET
BASOS ABS: 0 10*3/uL (ref 0.0–0.1)
BASOS PCT: 0.8 % (ref 0.0–3.0)
EOS ABS: 0.2 10*3/uL (ref 0.0–0.7)
Eosinophils Relative: 7.5 % — ABNORMAL HIGH (ref 0.0–5.0)
HEMATOCRIT: 44.6 % (ref 39.0–52.0)
Hemoglobin: 15 g/dL (ref 13.0–17.0)
LYMPHS ABS: 1.6 10*3/uL (ref 0.7–4.0)
LYMPHS PCT: 47.8 % — AB (ref 12.0–46.0)
MCHC: 33.6 g/dL (ref 30.0–36.0)
MCV: 81.8 fl (ref 78.0–100.0)
MONO ABS: 0.3 10*3/uL (ref 0.1–1.0)
Monocytes Relative: 8.7 % (ref 3.0–12.0)
NEUTROS ABS: 1.2 10*3/uL — AB (ref 1.4–7.7)
NEUTROS PCT: 35.2 % — AB (ref 43.0–77.0)
PLATELETS: 229 10*3/uL (ref 150.0–400.0)
RBC: 5.45 Mil/uL (ref 4.22–5.81)
RDW: 13.7 % (ref 11.5–15.5)
WBC: 3.3 10*3/uL — ABNORMAL LOW (ref 4.0–10.5)

## 2018-01-03 MED ORDER — FLUTICASONE PROPIONATE 50 MCG/ACT NA SUSP: 1.0000 | Freq: Every day | NASAL | 3 refills | Status: AC

## 2018-01-03 MED ORDER — ALBUTEROL SULFATE HFA 108 (90 BASE) MCG/ACT IN AERS: 2.0000 | INHALATION_SPRAY | RESPIRATORY_TRACT | 3 refills | Status: DC | PRN

## 2018-01-03 MED ORDER — BUDESONIDE-FORMOTEROL FUMARATE 80-4.5 MCG/ACT IN AERO: 2.0000 | INHALATION_SPRAY | Freq: Two times a day (BID) | RESPIRATORY_TRACT | 11 refills | Status: DC

## 2018-01-03 NOTE — Assessment & Plan Note (Signed)
Preventative protocols reviewed and updated unless pt declined. Discussed healthy diet and lifestyle.  

## 2018-01-03 NOTE — Patient Instructions (Addendum)
Urine and blood work today.  Work on [employer]. Look into mediterranean diet.  You are doing well today. Return as needed or in 1 year for next physical.   Health Maintenance, Male A healthy lifestyle and preventive care is important for your health and wellness. Ask your health care provider about what schedule of regular examinations is right for you. What should I know about weight and diet? Eat a Healthy Diet  Eat plenty of vegetables, fruits, whole grains, low-fat dairy products, and lean protein.  Do not eat a lot of foods high in solid fats, added sugars, or salt.  Maintain a Healthy Weight Regular exercise can help you achieve or maintain a healthy weight. You should:  Do at least 150 minutes of exercise each week. The exercise should increase your heart rate and make you sweat (moderate-intensity exercise).  Do strength-training exercises at least twice a week.  Watch Your Levels of Cholesterol and Blood Lipids  Have your blood tested for lipids and cholesterol every 5 years starting at 22 years of age. If you are at high risk for heart disease, you should start having your blood tested when you are 22 years old. You may need to have your cholesterol levels checked more often if: ? Your lipid or cholesterol levels are high. ? You are older than 22 years of age. ? You are at high risk for heart disease.  What should I know about cancer screening? Many types of cancers can be detected early and may often be prevented. Lung Cancer  You should be screened every year for lung cancer if: ? You are a current smoker who has smoked for at least 30 years. ? You are a former smoker who has quit within the past 15 years.  Talk to your health care provider about your screening options, when you should start screening, and how often you should be screened.  Colorectal Cancer  Routine colorectal cancer screening usually begins at 22 years of age and should be repeated every 5-10  years until you are 22 years old. You may need to be screened more often if early forms of precancerous polyps or small growths are found. Your health care provider may recommend screening at an earlier age if you have risk factors for colon cancer.  Your health care provider may recommend using home test kits to check for hidden blood in the stool.  A small camera at the end of a tube can be used to examine your colon (sigmoidoscopy or colonoscopy). This checks for the earliest forms of colorectal cancer.  Prostate and Testicular Cancer  Depending on your age and overall health, your health care provider may do certain tests to screen for prostate and testicular cancer.  Talk to your health care provider about any symptoms or concerns you have about testicular or prostate cancer.  Skin Cancer  Check your skin from head to toe regularly.  Tell your health care provider about any new moles or changes in moles, especially if: ? There is a change in a mole's size, shape, or color. ? You have a mole that is larger than a pencil eraser.  Always use sunscreen. Apply sunscreen liberally and repeat throughout the day.  Protect yourself by wearing long sleeves, pants, a wide-brimmed hat, and sunglasses when outside.  What should I know about heart disease, diabetes, and high blood pressure?  If you are 63-77 years of age, have your blood pressure checked every 3-5 years. If you are  22 years of age or older, have your blood pressure checked every year. You should have your blood pressure measured twice-once when you are at a hospital or clinic, and once when you are not at a hospital or clinic. Record the average of the two measurements. To check your blood pressure when you are not at a hospital or clinic, you can use: ? An automated blood pressure machine at a pharmacy. ? A home blood pressure monitor.  Talk to your health care provider about your target blood pressure.  If you are between  3345-22 years old, ask your health care provider if you should take aspirin to prevent heart disease.  Have regular diabetes screenings by checking your fasting blood sugar level. ? If you are at a normal weight and have a low risk for diabetes, have this test once every three years after the age of 22. ? If you are overweight and have a high risk for diabetes, consider being tested at a younger age or more often.  A one-time screening for abdominal aortic aneurysm (AAA) by ultrasound is recommended for men aged 65-75 years who are current or former smokers. What should I know about preventing infection? Hepatitis B If you have a higher risk for hepatitis B, you should be screened for this virus. Talk with your health care provider to find out if you are at risk for hepatitis B infection. Hepatitis C Blood testing is recommended for:  Everyone born from [birth year] through 1965.  Anyone with known risk factors for hepatitis C.  Sexually Transmitted Diseases (STDs)  You should be screened each year for STDs including gonorrhea and chlamydia if: ? You are sexually active and are younger than 22 years of age. ? You are older than 22 years of age and your health care provider tells you that you are at risk for this type of infection. ? Your sexual activity has changed since you were last screened and you are at an increased risk for chlamydia or gonorrhea. Ask your health care provider if you are at risk.  Talk with your health care provider about whether you are at high risk of being infected with HIV. Your health care provider may recommend a prescription medicine to help prevent HIV infection.  What else can I do?  Schedule regular health, dental, and eye exams.  Stay current with your vaccines (immunizations).  Do not use any tobacco products, such as cigarettes, chewing tobacco, and e-cigarettes. If you need help quitting, ask your health care provider.  Limit alcohol intake to no more than  2 drinks per day. One drink equals 12 ounces of beer, 5 ounces of wine, or 1 ounces of hard liquor.  Do not use street drugs.  Do not share needles.  Ask your health care provider for help if you need support or information about quitting drugs.  Tell your health care provider if you often feel depressed.  Tell your health care provider if you have ever been abused or do not feel safe at home. This information is not intended to replace advice given to you by your health care provider. Make sure you discuss any questions you have with your health care provider. Document Released: 03/10/2008 Document Revised: 05/11/2016 Document Reviewed: 06/16/2015 Elsevier Interactive Patient Education  2018 [employer]  Mediterranean Diet A Mediterranean diet refers to food and lifestyle choices that are based on the traditions of countries located on the [place]. This way of eating has been shown to  help prevent certain conditions and improve outcomes for people who have chronic diseases, like kidney disease and heart disease. What are tips for following this plan? Lifestyle  Cook and eat meals together with your family, when possible.  Drink enough fluid to keep your urine clear or pale yellow.  Be physically active every day. This includes: ? Aerobic exercise like running or swimming. ? Leisure activities like gardening, walking, or housework.  Get 7-8 hours of sleep each night.  If recommended by your health care provider, drink red wine in moderation. This means 1 glass a day for nonpregnant women and 2 glasses a day for men. A glass of wine equals 5 oz (150 mL). Reading food labels  Check the serving size of packaged foods. For foods such as rice and pasta, the serving size refers to the amount of cooked product, not dry.  Check the total fat in packaged foods. Avoid foods that have saturated fat or trans fats.  Check the ingredients list for added sugars, such as corn  syrup. Shopping  At the grocery store, buy most of your food from the areas near the walls of the store. This includes: ? Fresh fruits and vegetables (produce). ? Grains, beans, nuts, and seeds. Some of these may be available in unpackaged forms or large amounts (in bulk). ? Fresh seafood. ? Poultry and eggs. ? Low-fat dairy products.  Buy whole ingredients instead of prepackaged foods.  Buy fresh fruits and vegetables in-season from local farmers markets.  Buy frozen fruits and vegetables in resealable bags.  If you do not have access to quality fresh seafood, buy precooked frozen shrimp or canned fish, such as tuna, salmon, or sardines.  Buy small amounts of raw or cooked vegetables, salads, or olives from the deli or salad bar at your store.  Stock your pantry so you always have certain foods on hand, such as olive oil, canned tuna, canned tomatoes, rice, pasta, and beans. Cooking  Cook foods with extra-virgin olive oil instead of using butter or other vegetable oils.  Have meat as a side dish, and have vegetables or grains as your main dish. This means having meat in small portions or adding small amounts of meat to foods like pasta or stew.  Use beans or vegetables instead of meat in common dishes like chili or lasagna.  Experiment with different cooking methods. Try roasting or broiling vegetables instead of steaming or sauteing them.  Add frozen vegetables to soups, stews, pasta, or rice.  Add nuts or seeds for added healthy fat at each meal. You can add these to yogurt, salads, or vegetable dishes.  Marinate fish or vegetables using olive oil, lemon juice, garlic, and fresh herbs. Meal planning  Plan to eat 1 vegetarian meal one day each week. Try to work up to 2 vegetarian meals, if possible.  Eat seafood 2 or more times a week.  Have healthy snacks readily available, such as: ? Vegetable sticks with hummus. ? [place] yogurt. ? Fruit and nut trail mix.  Eat  balanced meals throughout the week. This includes: ? Fruit: 2-3 servings a day ? Vegetables: 4-5 servings a day ? Low-fat dairy: 2 servings a day ? Fish, poultry, or lean meat: 1 serving a day ? Beans and legumes: 2 or more servings a week ? Nuts and seeds: 1-2 servings a day ? Whole grains: 6-8 servings a day ? Extra-virgin olive oil: 3-4 servings a day  Limit red meat and sweets to only a few  servings a month What are my food choices?  Mediterranean diet ? Recommended ? Grains: Whole-grain pasta. Brown rice. Bulgar wheat. Polenta. Couscous. Whole-wheat bread. Orpah Cobb. ? Vegetables: Artichokes. Beets. Broccoli. Cabbage. Carrots. Eggplant. Green beans. Chard. Kale. Spinach. Onions. Leeks. Peas. Squash. Tomatoes. Peppers. Radishes. ? Fruits: Apples. Apricots. Avocado. Berries. Bananas. Cherries. Dates. Figs. Grapes. Lemons. Melon. Oranges. Peaches. Plums. Pomegranate. ? Meats and other protein foods: Beans. Almonds. Sunflower seeds. Pine nuts. Peanuts. Cod. Salmon. Scallops. Shrimp. Tuna. Tilapia. Clams. Oysters. Eggs. ? Dairy: Low-fat milk. Cheese. Greek yogurt. ? Beverages: Water. Red wine. Herbal tea. ? Fats and oils: Extra virgin olive oil. Avocado oil. Grape seed oil. ? Sweets and desserts: [place] yogurt with honey. Baked apples. Poached pears. Trail mix. ? Seasoning and other foods: Basil. Cilantro. Coriander. Cumin. Mint. Parsley. Sage. Rosemary. Tarragon. Garlic. Oregano. Thyme. Pepper. Balsalmic vinegar. Tahini. Hummus. Tomato sauce. Olives. Mushrooms. ? Limit these ? Grains: Prepackaged pasta or rice dishes. Prepackaged cereal with added sugar. ? Vegetables: Deep fried potatoes (french fries). ? Fruits: Fruit canned in syrup. ? Meats and other protein foods: Beef. Pork. Lamb. Poultry with skin. Hot dogs. Tomasa Blase. ? Dairy: Ice cream. Sour cream. Whole milk. ? Beverages: Juice. Sugar-sweetened soft drinks. Beer. Liquor and spirits. ? Fats and oils: Butter. Canola oil.  Vegetable oil. Beef fat (tallow). Lard. ? Sweets and desserts: Cookies. Cakes. Pies. Candy. ? Seasoning and other foods: Mayonnaise. Premade sauces and marinades. ? The items listed may not be a complete list. Talk with your dietitian about what dietary choices are right for you. Summary  The Mediterranean diet includes both food and lifestyle choices.  Eat a variety of fresh fruits and vegetables, beans, nuts, seeds, and whole grains.  Limit the amount of red meat and sweets that you eat.  Talk with your health care provider about whether it is safe for you to drink red wine in moderation. This means 1 glass a day for nonpregnant women and 2 glasses a day for men. A glass of wine equals 5 oz (150 mL). This information is not intended to replace advice given to you by your health care provider. Make sure you discuss any questions you have with your health care provider. Document Released: 05/05/2016 Document Revised: 06/07/2016 Document Reviewed: 05/05/2016 Elsevier Interactive Patient Education  [employer].

## 2018-01-03 NOTE — Progress Notes (Signed)
BP 124/82 (BP Location: Left Arm, Patient Position: Sitting, Cuff Size: Normal)   Pulse 88   Temp 98.2 F (36.8 C) (Oral)   Ht 6\' 4"  (1.93 m)   Wt 219 lb 8 oz (99.6 kg)   SpO2 96%   PF 600 L/min   BMI 26.72 kg/m    CC: CPE Subjective:    Patient ID: Jordan Ferguson, male    DOB: [birth year], 22 y.o.   MRN: [id number]  HPI: Kaheem Halleck is a 22 y.o. male presenting on 01/03/2018 for Annual Exam   Last visit was 2 yrs ago.  Seasonal allergies currently in flare.   Preventative: Flu shot yearly Tdap 2008 Seat belt use discussed. Sunscreen use discussed. No changing moles on skin.  Non smoker  Alcohol - 1-2 beers/wk Rec drugs - none Currently sexually active - 2 partners relationship - interested in STD screening.   Lives with mother and brother, 1 dog and rabbit Edu: finished at [employer] Occ: retail, some modeling and acting, drummer in band, some pro-wrestling  Activity: lifts weights at gym, cardio Diet: some water, fruits/vegetables some  Relevant past medical, surgical, family and social history reviewed and updated as indicated. Interim medical history since our last visit reviewed. Allergies and medications reviewed and updated. Outpatient Medications Prior to Visit  Medication Sig Dispense Refill  . Cetirizine HCl (ZYRTEC ALLERGY) 10 MG CAPS Take 1 capsule (10 mg total) by mouth daily.    . famotidine (PEPCID) 20 MG tablet Take 20 mg by mouth daily as needed for heartburn or indigestion.    Marland Kitchen ibuprofen (ADVIL,MOTRIN) 600 MG tablet Take 600 mg by mouth every 6 (six) hours as needed.    . sodium chloride (OCEAN) 0.65 % SOLN nasal spray Place 2 sprays into both nostrils every 2 (two) hours while awake.  0  . budesonide-formoterol (SYMBICORT) 80-4.5 MCG/ACT inhaler Inhale 2 puffs into the lungs 2 (two) times daily. 1 Inhaler 11  . VENTOLIN HFA 108 (90 Base) MCG/ACT inhaler INHALE 2 PUFFS INTO THE LUNGS EVERY 4 (FOUR) HOURS AS NEEDED FOR  WHEEZING OR SHORTNESS OF BREATH. 18 Inhaler 0  . levalbuterol (XOPENEX) 1.25 MG/3ML nebulizer solution Take 1.25 mg by nebulization every 6 (six) hours as needed for wheezing or shortness of breath. 72 mL 1  . montelukast (SINGULAIR) 10 MG tablet Take 1 tablet (10 mg total) by mouth at bedtime. 30 tablet 6  . fluticasone (FLONASE) 50 MCG/ACT nasal spray Place 1 spray into both nostrils daily. 16 g 0   No facility-administered medications prior to visit.      Per HPI unless specifically indicated in ROS section below Review of Systems  Constitutional: Negative for activity change, appetite change, chills, fatigue, fever and unexpected weight change.  HENT: Negative for hearing loss.   Eyes: Negative for visual disturbance.  Respiratory: Positive for cough and wheezing. Negative for chest tightness and shortness of breath.   Cardiovascular: Negative for chest pain, palpitations and leg swelling.  Gastrointestinal: Negative for abdominal distention, abdominal pain, blood in stool, constipation, diarrhea, nausea and vomiting.  Genitourinary: Negative for difficulty urinating and hematuria.  Musculoskeletal: Negative for arthralgias, myalgias and neck pain.  Skin: Negative for rash.  Neurological: Positive for headaches. Negative for dizziness, seizures and syncope.  Hematological: Negative for adenopathy. Does not bruise/bleed easily.  Psychiatric/Behavioral: Negative for dysphoric mood. The patient is nervous/anxious.        Objective:    BP 124/82 (BP Location: Left Arm, Patient  Position: Sitting, Cuff Size: Normal)   Pulse 88   Temp 98.2 F (36.8 C) (Oral)   Ht 6\' 4"  (1.93 m)   Wt 219 lb 8 oz (99.6 kg)   SpO2 96%   PF 600 L/min   BMI 26.72 kg/m   Wt Readings from Last 3 Encounters:  01/03/18 219 lb 8 oz (99.6 kg)  08/26/17 195 lb (88.5 kg)  02/12/16 212 lb (96.2 kg)    Physical Exam  Constitutional: He is oriented to person, place, and time. He appears well-developed and  well-nourished. No distress.  HENT:  Head: Normocephalic and atraumatic.  Right Ear: Hearing, tympanic membrane, external ear and ear canal normal.  Left Ear: Hearing, tympanic membrane, external ear and ear canal normal.  Nose: Nose normal.  Mouth/Throat: Uvula is midline, oropharynx is clear and moist and mucous membranes are normal. No oropharyngeal exudate, posterior oropharyngeal edema or posterior oropharyngeal erythema.  Evidently congested  Eyes: Pupils are equal, round, and reactive to light. Conjunctivae and EOM are normal. No scleral icterus.  Neck: Normal range of motion. Neck supple. No thyromegaly present.  Cardiovascular: Normal rate, regular rhythm, normal heart sounds and intact distal pulses.  No murmur heard. Pulses:      Radial pulses are 2+ on the right side, and 2+ on the left side.  Pulmonary/Chest: Effort normal and breath sounds normal. No respiratory distress. He has no wheezes. He has no rales.  Abdominal: Soft. Bowel sounds are normal. He exhibits no distension and no mass. There is no tenderness. There is no rebound and no guarding.  Musculoskeletal: Normal range of motion. He exhibits no edema.  Lymphadenopathy:    He has no cervical adenopathy.  Neurological: He is alert and oriented to person, place, and time.  CN grossly intact, station and gait intact  Skin: Skin is warm and dry. No rash noted.  Psychiatric: He has a normal mood and affect. His behavior is normal. Judgment and thought content normal.  Nursing note and vitals reviewed.  Depression screen [facility] 2/9 01/03/2018 01/27/2016  Decreased Interest 0 1  Down, Depressed, Hopeless 0 1  PHQ - 2 Score 0 2  Altered sleeping - 2  Tired, decreased energy - 1  Change in appetite - 0  Feeling bad or failure about yourself  - 2  Trouble concentrating - 2  Moving slowly or fidgety/restless - 2  Suicidal thoughts - 0  PHQ-9 Score - 11  Difficult doing work/chores - Somewhat difficult    Baseline peak  flow today = 600 (87% expected) Anticipated for age and height = ~690 L/min    Assessment & Plan:  Discussed Tetanus and HPV vaccines - pt declines today, but will call for nurse visit if decides to receive shots.  Problem List Items Addressed This Visit    Asthma, moderate persistent    Stable period. meds refilled. Peak flow baseline today = 600 L/min.       Relevant Medications   budesonide-formoterol (SYMBICORT) 80-4.5 MCG/ACT inhaler   albuterol (VENTOLIN HFA) 108 (90 Base) MCG/ACT inhaler   Health maintenance examination - Primary    Preventative protocols reviewed and updated unless pt declined. Discussed healthy diet and lifestyle.       Leukopenia    Update CBC      Relevant Orders   CBC with Differential/Platelet    Other Visit Diagnoses    Screen for STD (sexually transmitted disease)       Relevant Orders   HIV antibody  RPR   C. trachomatis/N. gonorrhoeae RNA       Meds ordered this encounter  Medications  . budesonide-formoterol (SYMBICORT) 80-4.5 MCG/ACT inhaler    Sig: Inhale 2 puffs into the lungs 2 (two) times daily.    Dispense:  1 Inhaler    Refill:  11  . albuterol (VENTOLIN HFA) 108 (90 Base) MCG/ACT inhaler    Sig: Inhale 2 puffs into the lungs every 4 (four) hours as needed for wheezing or shortness of breath.    Dispense:  18 Inhaler    Refill:  3  . fluticasone (FLONASE) 50 MCG/ACT nasal spray    Sig: Place 1 spray into both nostrils daily.    Dispense:  16 g    Refill:  3   Orders Placed This Encounter  Procedures  . C. trachomatis/N. gonorrhoeae RNA  . CBC with Differential/Platelet  . HIV antibody  . RPR    Follow up plan: Return in about 1 year (around 01/04/2019).  Eustaquio BoydenJavier Lamontae Ricardo, MD

## 2018-01-03 NOTE — Assessment & Plan Note (Addendum)
Stable period. meds refilled. Peak flow baseline today = 600 L/min.

## 2018-01-03 NOTE — Assessment & Plan Note (Signed)
Update CBC. 

## 2018-01-04 LAB — HIV ANTIBODY (ROUTINE TESTING W REFLEX): HIV: NONREACTIVE

## 2018-01-04 LAB — C. TRACHOMATIS/N. GONORRHOEAE RNA
C. TRACHOMATIS RNA, TMA: NOT DETECTED
N. GONORRHOEAE RNA, TMA: NOT DETECTED

## 2018-01-04 LAB — RPR: RPR: NONREACTIVE

## 2018-01-22 ENCOUNTER — Telehealth: Payer: Self-pay

## 2018-01-22 ENCOUNTER — Ambulatory Visit: Payer: Federal, State, Local not specified - PPO | Admitting: Family Medicine

## 2018-01-22 NOTE — Telephone Encounter (Signed)
PLEASE NOTE: All timestamps contained within this report are represented as [place] Standard Time. CONFIDENTIALTY NOTICE: This fax transmission is intended only for the addressee. It contains information that is legally privileged, confidential or otherwise protected from use or disclosure. If you are not the intended recipient, you are strictly prohibited from reviewing, disclosing, copying using or disseminating any of this information or taking any action in reliance on or regarding this information. If you have received this fax in error, please notify us immediately by telephone so that we can arrange for its return to [place]. Phone: [phone], Toll-Free: [phone], Fax: [phone] Page: 1 of 1 Call Id: [id number] [facility] Night - Client Nonclinical Telephone Record [facility] Client [facility] Night - Client Client Site [facility] - Night Physician [facility] - PHYSICIAN, NOT LISTED- MD Contact Type Call Who Is Calling Patient / Member / Family / Caregiver Caller Name Jordan Ferguson Caller Phone Number [phone] Patient Name Jordan Ferguson, Jordan Ferguson Patient DOB 1995/11/15 Call Type Message Only Information Provided Reason for Call Request for General Office Information Initial Comment Caller states she needs to cancel appointment for son with with Dr Marcos Eke. Additional Comment Call Closed By: Nadene Rubins Transaction Date/Time: 01/21/2018 11:27:41 PM (ET)

## 2018-01-23 NOTE — Telephone Encounter (Signed)
No charge. 

## 2018-12-19 ENCOUNTER — Other Ambulatory Visit: Payer: Self-pay

## 2018-12-19 NOTE — Telephone Encounter (Signed)
Refill request received from [facility] in [place] for levalbuterol nebulizer solution. Last fill in epic 2017. Last office visit was on 01/03/2018. No future appointments. Ok to fill?

## 2018-12-20 MED ORDER — LEVALBUTEROL HCL 1.25 MG/3ML IN NEBU: 1.2500 mg | INHALATION_SOLUTION | Freq: Four times a day (QID) | RESPIRATORY_TRACT | 1 refills | Status: DC | PRN

## 2018-12-20 NOTE — Telephone Encounter (Signed)
[facility] Night - Client Nonclinical Telephone Record [facility] Call Center Client [facility] Night - Client Client Site [facility] - Night Physician Eustaquio Boyden - MD Contact Type Call Who Is Calling Patient / Member / Family / Caregiver Caller Name Sherian Rein Phone Number [phone] Patient Name Jordan Ferguson Patient DOB [birth year] Call Type Message Only Information Provided Reason for Call Medication Question / Request Initial Comment Caller states she needs her son prescription refill called in. Additional Comment Call Closed By: Ellis Parents Transaction Date/Time: 12/20/2018 7:44:08 AM (ET)

## 2018-12-20 NOTE — Telephone Encounter (Signed)
E prescribed. plz notify pt 

## 2018-12-20 NOTE — Telephone Encounter (Signed)
Mom called checking on refill Best number [phone]

## 2018-12-20 NOTE — Telephone Encounter (Signed)
Left message on vm per dpr notifying pt Dr. Reece Agar sent in refill.

## 2018-12-26 ENCOUNTER — Telehealth: Payer: Self-pay | Admitting: Family Medicine

## 2018-12-26 NOTE — Telephone Encounter (Signed)
Pt's mom is calling and stated the pt no longer takes symbicort. The pt went to Allergy doctor and they gave him Virgel Bouquet and this is what the pt now takes. The pt no longer go to the allergy specialist and need refill for Breo. Please call mom if any questions   [facility]

## 2018-12-26 NOTE — Telephone Encounter (Signed)
Left message on cell phn vm for pt to call back.  Need to discuss message left my his mother concerning change in meds and refill request.   Spoke with pt's mother, Jordan Ferguson (no current dpr), informing her I cannot discuss pt's medical info with her. Notified her I left a message for him to call us back.

## 2018-12-27 NOTE — Telephone Encounter (Signed)
Left message on cell phn vm for pt to call back.  Need to discuss message left my his mother concerning change in meds and refill request.

## 2018-12-28 NOTE — Telephone Encounter (Signed)
Left message on cell phn vm for pt to call back.  Mailing a letter.   Need to discuss message left my his mother concerning change in meds and refill request.

## 2018-12-31 MED ORDER — FLUTICASONE FUROATE-VILANTEROL 100-25 MCG/INH IN AEPB: 1.0000 | INHALATION_SPRAY | Freq: Every day | RESPIRATORY_TRACT | 6 refills | Status: DC

## 2018-12-31 NOTE — Addendum Note (Signed)
Addended by: Eustaquio Boyden on: 12/31/2018 04:30 PM   Modules accepted: Orders

## 2018-12-31 NOTE — Telephone Encounter (Signed)
breo refilled 

## 2018-12-31 NOTE — Addendum Note (Signed)
Addended by: Nanci Pina on: 12/31/2018 04:28 PM   Modules accepted: Orders

## 2018-12-31 NOTE — Telephone Encounter (Signed)
Best number [phone]  Evette (mom)  My would like you to call her back and she will do a 3 way call with pt

## 2018-12-31 NOTE — Telephone Encounter (Signed)
Spoke with pt/mom about new med.  Pt confirms he no longer uses Symbicort but now uses Breo instead. [Updated pt med list.]  Needs refill sent to [facility]. I wasn't sure how much you wanted to refill.

## 2019-01-01 NOTE — Telephone Encounter (Signed)
Noted  

## 2019-01-08 ENCOUNTER — Other Ambulatory Visit: Payer: Self-pay | Admitting: Family Medicine

## 2019-09-10 ENCOUNTER — Encounter: Payer: Self-pay | Admitting: Emergency Medicine

## 2019-09-10 ENCOUNTER — Other Ambulatory Visit: Payer: Self-pay

## 2019-09-10 ENCOUNTER — Ambulatory Visit
Admission: EM | Admit: 2019-09-10 | Discharge: 2019-09-10 | Disposition: A | Discharge status: Home / Self Care | Payer: Federal, State, Local not specified - PPO | Attending: Family Medicine | Admitting: Family Medicine

## 2019-09-10 ENCOUNTER — Ambulatory Visit (INDEPENDENT_AMBULATORY_CARE_PROVIDER_SITE_OTHER): Payer: Federal, State, Local not specified - PPO

## 2019-09-10 DIAGNOSIS — S8992XA Unspecified injury of left lower leg, initial encounter: Secondary | ICD-10-CM | POA: Diagnosis not present

## 2019-09-10 DIAGNOSIS — M25562 Pain in left knee: Secondary | ICD-10-CM | POA: Diagnosis not present

## 2019-09-10 MED ORDER — MELOXICAM 15 MG PO TABS: 15.0000 mg | ORAL_TABLET | Freq: Every day | ORAL | 0 refills | Status: DC | PRN

## 2019-09-10 NOTE — ED Triage Notes (Addendum)
Patient in today c/o left knee injury on Sunday (09/08/19) while wrestling. Patient does some professional wrestling. Patient also requesting a screening covid test today.

## 2019-09-10 NOTE — Discharge Instructions (Addendum)
Xray negative.  Ligaments intact. Likely mild sprain.  Rest, ice, elevate. Medication as prescribed.  No wresting x 1 week.  If persists, see Ortho (recommend [facility] or [facility]).  Take care  Dr. Lacinda Axon

## 2019-09-10 NOTE — ED Provider Notes (Signed)
[facility]    CSN: [id number] Arrival date & time: 09/10/19  [place]  History   Chief Complaint Chief Complaint  Patient presents with  . Knee Injury    DOI 09/08/19 left   HPI  23 year old male presents with a knee injury.  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  Patient reports that he threw a kick and in doing so injured his left knee.  Injury occurred on Sunday.  Patient states that he is unsure of any swelling.  He does not recall a pop or click.  Patient reports that he has some pain with ambulation.  He states that his pain seems to be improved when he is standing on his feet.  He rates his pain is 8/10 in severity.  No relieving factors.  He states that it also seems to be exacerbated when he flexes his knee.  No other reported symptoms.  Patient denies any respiratory symptoms.  States that he would like a Covid test as it is required for his acting.  No other complaints.  PMH, Surgical Hx, Family Hx, Social History reviewed and updated as below.  Past Medical History:  Diagnosis Date  . Asthma   . Reflux   . Seizures ([place])    None in many years, no meds for years    Patient Active Problem List   Diagnosis Date Noted  . Leukopenia 01/03/2018  . Health maintenance examination 01/27/2016  . Social anxiety disorder 01/27/2016  . Epididymal cyst 11/26/2014  . Seasonal allergic rhinitis 11/26/2014  . Asthma, moderate persistent     Past Surgical History:  Procedure Laterality Date  . CONCEALED PENIS REPAIR    . DENTAL SURGERY    . WISDOM TOOTH EXTRACTION  2017    Home Medications    Prior to Admission medications   Medication Sig Start Date End Date Taking? Authorizing Provider  albuterol (PROVENTIL HFA;VENTOLIN HFA) 108 (90 Base) MCG/ACT inhaler INHALE 2 PUFFS INTO THE LUNGS EVERY 4 HOURS AS NEEDED FOR WHEEZING OR SHORTNESS OF BREATH. 01/10/19  Yes Ria Bush, MD  Cetirizine HCl (ZYRTEC ALLERGY) 10 MG CAPS Take 1  capsule (10 mg total) by mouth daily. 11/26/14  Yes Ria Bush, MD  fluticasone [facility]) 50 MCG/ACT nasal spray Place 1 spray into both nostrils daily. 01/03/18 09/10/19 Yes Ria Bush, MD  fluticasone furoate-vilanterol (BREO ELLIPTA) 100-25 MCG/INH AEPB Inhale 1 puff into the lungs daily. 12/31/18  Yes Ria Bush, MD  ibuprofen (ADVIL,MOTRIN) 600 MG tablet Take 600 mg by mouth every 6 (six) hours as needed.   Yes [provider]  levalbuterol Penne Lash) 1.25 MG/3ML nebulizer solution Take 1.25 mg by nebulization every 6 (six) hours as needed for wheezing or shortness of breath. 12/20/18 07/19/20 Yes Ria Bush, MD  sodium chloride (OCEAN) 0.65 % SOLN nasal spray Place 2 sprays into both nostrils every 2 (two) hours while awake. 09/06/15  Yes Betancourt, Aura Fey, NP  meloxicam (MOBIC) 15 MG tablet Take 1 tablet (15 mg total) by mouth daily as needed for pain. 09/10/19   Coral Spikes, DO  famotidine (PEPCID) 20 MG tablet Take 20 mg by mouth daily as needed for heartburn or indigestion.  09/10/19  [provider]  montelukast (SINGULAIR) 10 MG tablet Take 1 tablet (10 mg total) by mouth at bedtime. 01/27/16 09/10/19  Ria Bush, MD    Family History Family History  Problem Relation Age of Onset  . Cancer Maternal Aunt 50  lung (smoker)  . Stroke Maternal Uncle 80  . CAD Neg Hx   . Diabetes Neg Hx     Social History Social History   Tobacco Use  . Smoking status: Never Smoker  . Smokeless tobacco: Never Used  Substance Use Topics  . Alcohol use: Yes    Alcohol/week: 0.0 standard drinks    Comment: social  . Drug use: No     Allergies   Amoxicillin and Lamictal [lamotrigine]   Review of Systems Review of Systems  Constitutional: Negative.   HENT: Negative.   Respiratory: Negative.   Musculoskeletal:       Knee pain/injury.   Physical Exam Triage Vital Signs ED Triage Vitals  Enc Vitals Group     BP 09/10/19 1704 134/85       Pulse Rate 09/10/19 1704 87     Resp 09/10/19 1704 18     Temp 09/10/19 1704 97.6 F (36.4 C)     Temp Source 09/10/19 1704 Oral     SpO2 09/10/19 1704 100 %     Weight 09/10/19 1705 245 lb (111.1 kg)     Height 09/10/19 1705 6\' 4"  (1.93 m)     Head Circumference --      Peak Flow --      Pain Score 09/10/19 1704 8     Pain Loc --      Pain Edu? --      Excl. in GC? --    Updated Vital Signs BP 134/85 (BP Location: Left Arm)   Pulse 87   Temp 97.6 F (36.4 C) (Oral)   Resp 18   Ht 6\' 4"  (1.93 m)   Wt 111.1 kg   SpO2 100%   BMI 29.82 kg/m   Visual Acuity Right Eye Distance:   Left Eye Distance:   Bilateral Distance:    Right Eye Near:   Left Eye Near:    Bilateral Near:     Physical Exam Vitals and nursing note reviewed.  Constitutional:      General: He is not in acute distress.    Appearance: Normal appearance. He is not ill-appearing.  HENT:     Head: Normocephalic and atraumatic.  Eyes:     General:        Right eye: No discharge.        Left eye: No discharge.     Conjunctiva/sclera: Conjunctivae normal.  Pulmonary:     Effort: Pulmonary effort is normal. No respiratory distress.  Musculoskeletal:     Comments: Left knee -suspect mild effusion.  No anterior joint line tenderness.  Ligaments intact.  Skin:    General: Skin is warm.     Findings: No bruising, erythema or rash.  Neurological:     Mental Status: He is alert.  Psychiatric:        Mood and Affect: Mood normal.        Behavior: Behavior normal.    UC Treatments / Results  Labs (all labs ordered are listed, but only abnormal results are displayed) Labs Reviewed  NOVEL CORONAVIRUS, NAA (HOSP ORDER, SEND-OUT TO REF LAB; TAT 18-24 HRS)    EKG   Radiology DG Knee Complete 4 Views Left  Result Date: 09/10/2019 CLINICAL DATA:  The patient suffered a left knee injury wrestling 09/08/2019. Initial encounter. EXAM: LEFT KNEE - COMPLETE 4+ VIEW COMPARISON:  Plain films left knee  06/28/2010. FINDINGS: No evidence of fracture, dislocation, or joint effusion. No evidence of arthropathy or other focal bone  abnormality. Soft tissues are unremarkable. IMPRESSION: Negative exam. Electronically Signed   By: Drusilla Kanner M.D.   On: 09/10/2019 17:41    Procedures Procedures (including critical care time)  Medications Ordered in UC Medications - No data to display  Initial Impression / Assessment and Plan / UC Course  I have reviewed the triage vital signs and the nursing notes.  Pertinent labs & imaging results that were available during my care of the patient were reviewed by me and considered in my medical decision making (see chart for details).    23 year old male presents with a knee injury.  X-ray negative.  Suspect mild acute sprain.  Ligaments intact.  No indication for additional imaging at this time.  Rest, ice, elevation.  Advised to avoid wrestling for 1 week.  Mobic as prescribed.  If persists, recommended seeing orthopedics.  Awaiting patient's Covid test.  He is asymptomatic.  Final Clinical Impressions(s) / UC Diagnoses   Final diagnoses:  Acute pain of left knee     Discharge Instructions     Xray negative.  Ligaments intact. Likely mild sprain.  Rest, ice, elevate. Medication as prescribed.  No wresting x 1 week.  If persists, see Ortho (recommend [facility] or [facility]).  Take care  Dr. Adriana Simas     ED Prescriptions    Medication Sig Dispense Auth. Provider   meloxicam (MOBIC) 15 MG tablet Take 1 tablet (15 mg total) by mouth daily as needed for pain. 30 tablet Tommie Sams, DO     PDMP not reviewed this encounter.   Tommie Sams, [place] 09/10/19 1811

## 2019-09-11 LAB — NOVEL CORONAVIRUS, NAA (HOSP ORDER, SEND-OUT TO REF LAB; TAT 18-24 HRS): SARS-CoV-2, NAA: NOT DETECTED

## 2019-10-05 ENCOUNTER — Other Ambulatory Visit: Payer: Self-pay | Admitting: Family Medicine

## 2019-10-16 ENCOUNTER — Encounter: Payer: Self-pay | Admitting: Emergency Medicine

## 2019-10-16 ENCOUNTER — Ambulatory Visit
Admission: EM | Admit: 2019-10-16 | Discharge: 2019-10-16 | Disposition: A | Discharge status: Home / Self Care | Payer: Federal, State, Local not specified - PPO | Attending: Family Medicine | Admitting: Family Medicine

## 2019-10-16 ENCOUNTER — Other Ambulatory Visit: Payer: Self-pay

## 2019-10-16 DIAGNOSIS — Z20822 Contact with and (suspected) exposure to covid-19: Secondary | ICD-10-CM | POA: Diagnosis not present

## 2019-10-16 DIAGNOSIS — Z7189 Other specified counseling: Secondary | ICD-10-CM | POA: Diagnosis not present

## 2019-10-16 NOTE — ED Triage Notes (Signed)
Patient here for COVID testing. No symptoms and no exposure. Needs this for work.

## 2019-10-16 NOTE — ED Provider Notes (Signed)
[facility] ____________________________________________  Time seen: Approximately 7:55 PM  I have reviewed the triage vital signs and the nursing notes.   HISTORY  Chief Complaint COVID testing   HPI Jordan Ferguson is a 24 y.o. male presenting for COVID-19 testing.  Patient reports that he feels well.  Denies cough, congestion, sore throat, fevers, changes in taste or smell, vomiting, diarrhea or chest pain or shortness of breath.  States he has to have COVID-19 testing periodically for work.  Denies known sick contacts.  Denies aggravating or alleviating factors.    Past Medical History:  Diagnosis Date  . Asthma   . Reflux   . Seizures ([place])    None in many years, no meds for years    Patient Active Problem List   Diagnosis Date Noted  . Leukopenia 01/03/2018  . Health maintenance examination 01/27/2016  . Social anxiety disorder 01/27/2016  . Epididymal cyst 11/26/2014  . Seasonal allergic rhinitis 11/26/2014  . Asthma, moderate persistent     Past Surgical History:  Procedure Laterality Date  . CONCEALED PENIS REPAIR    . DENTAL SURGERY    . WISDOM TOOTH EXTRACTION  2017     No current facility-administered medications for this encounter.  Current Outpatient Medications:  .  albuterol (PROVENTIL HFA;VENTOLIN HFA) 108 (90 Base) MCG/ACT inhaler, INHALE 2 PUFFS INTO THE LUNGS EVERY 4 HOURS AS NEEDED FOR WHEEZING OR SHORTNESS OF BREATH., Disp: 18 Inhaler, Rfl: 3 .  fluticasone furoate-vilanterol (BREO ELLIPTA) 100-25 MCG/INH AEPB, Inhale 1 puff into the lungs daily., Disp: 28 each, Rfl: 6 .  ibuprofen (ADVIL,MOTRIN) 600 MG tablet, Take 600 mg by mouth every 6 (six) hours as needed., Disp: , Rfl:  .  Cetirizine HCl (ZYRTEC ALLERGY) 10 MG CAPS, Take 1 capsule (10 mg total) by mouth daily., Disp: , Rfl:  .  fluticasone (FLONASE) 50 MCG/ACT nasal spray, Place 1 spray into both nostrils daily., Disp: 16 g, Rfl: 3 .  levalbuterol (XOPENEX) 1.25  MG/3ML nebulizer solution, Take 1.25 mg by nebulization every 6 (six) hours as needed for wheezing or shortness of breath., Disp: 72 mL, Rfl: 1 .  meloxicam (MOBIC) 15 MG tablet, Take 1 tablet (15 mg total) by mouth daily as needed for pain., Disp: 30 tablet, Rfl: 0 .  sodium chloride (OCEAN) 0.65 % SOLN nasal spray, Place 2 sprays into both nostrils every 2 (two) hours while awake., Disp: , Rfl: 0  Allergies Amoxicillin and Lamictal [lamotrigine]  Family History  Problem Relation Age of Onset  . Cancer Maternal Aunt 50       lung (smoker)  . Stroke Maternal Uncle 80  . CAD Neg Hx   . Diabetes Neg Hx     Social History Social History   Tobacco Use  . Smoking status: Never Smoker  . Smokeless tobacco: Never Used  Substance Use Topics  . Alcohol use: Yes    Alcohol/week: 0.0 standard drinks    Comment: social  . Drug use: No    Review of Systems Constitutional: No fever ENT: No sore throat. Cardiovascular: Denies chest pain. Respiratory: Denies shortness of breath. Gastrointestinal: No abdominal pain.  No nausea, no vomiting.  No diarrhea.  Musculoskeletal: Negative for back pain. Skin: Negative for rash.   ____________________________________________   PHYSICAL EXAM:  VITAL SIGNS: ED Triage Vitals  Enc Vitals Group     BP 10/16/19 1935 135/61     Pulse Rate 10/16/19 1935 70     Resp 10/16/19 1935 18  Temp 10/16/19 1935 98 F (36.7 C)     Temp Source 10/16/19 1935 Oral     SpO2 10/16/19 1935 100 %     Weight 10/16/19 1933 245 lb (111.1 kg)     Height 10/16/19 1933 6\' 4"  (1.93 m)     Head Circumference --      Peak Flow --      Pain Score 10/16/19 1933 0     Pain Loc --      Pain Edu? --      Excl. in GC? --     Constitutional: Alert and oriented. Well appearing and in no acute distress. Eyes: Conjunctivae are normal. ENT      Head: Normocephalic and atraumatic. Cardiovascular: Normal rate, regular rhythm. Grossly normal heart sounds.  Good  peripheral circulation. Respiratory: Normal respiratory effort without tachypnea nor retractions. Breath sounds are clear and equal bilaterally. No wheezes, rales, rhonchi. Musculoskeletal:Steady gait.  Neurologic:  Normal speech and language. Speech is normal. No gait instability.  Skin:  Skin is warm, dry and intact. No rash noted. Psychiatric: Mood and affect are normal. Speech and behavior are normal. Patient exhibits appropriate insight and judgment   ___________________________________________   LABS (all labs ordered are listed, but only abnormal results are displayed)  Labs Reviewed  NOVEL CORONAVIRUS, NAA (HOSP ORDER, SEND-OUT TO REF LAB; TAT 18-24 HRS)   ____________________________________________   PROCEDURES Procedures    INITIAL IMPRESSION / ASSESSMENT AND PLAN / ED COURSE  Pertinent labs & imaging results that were available during my care of the patient were reviewed by me and considered in my medical decision making (see chart for details).  Well-appearing patient.  Asymptomatic.  COVID-19 testing completed advice given.  Monitor.  Discussed follow up and return parameters including no resolution or any worsening concerns. Patient verbalized understanding and agreed to plan.   ____________________________________________   FINAL CLINICAL IMPRESSION(S) / ED DIAGNOSES  Final diagnoses:  Encounter for screening laboratory testing for COVID-19 virus  Advice given about COVID-19 virus infection     ED Discharge Orders    None       Note: This dictation was prepared with Dragon dictation along with smaller phrase technology. Any transcriptional errors that result from this process are unintentional.         10/18/19, NP 10/16/19 [phone]

## 2019-10-18 LAB — NOVEL CORONAVIRUS, NAA (HOSP ORDER, SEND-OUT TO REF LAB; TAT 18-24 HRS): SARS-CoV-2, NAA: NOT DETECTED

## 2019-11-11 DIAGNOSIS — F4325 Adjustment disorder with mixed disturbance of emotions and conduct: Secondary | ICD-10-CM | POA: Diagnosis not present

## 2019-11-19 DIAGNOSIS — F4325 Adjustment disorder with mixed disturbance of emotions and conduct: Secondary | ICD-10-CM | POA: Diagnosis not present

## 2019-12-01 DIAGNOSIS — S83412A Sprain of medial collateral ligament of left knee, initial encounter: Secondary | ICD-10-CM | POA: Diagnosis not present

## 2019-12-04 DIAGNOSIS — F4325 Adjustment disorder with mixed disturbance of emotions and conduct: Secondary | ICD-10-CM | POA: Diagnosis not present

## 2019-12-17 DIAGNOSIS — F4325 Adjustment disorder with mixed disturbance of emotions and conduct: Secondary | ICD-10-CM | POA: Diagnosis not present

## 2019-12-24 DIAGNOSIS — F4325 Adjustment disorder with mixed disturbance of emotions and conduct: Secondary | ICD-10-CM | POA: Diagnosis not present

## 2019-12-25 ENCOUNTER — Other Ambulatory Visit: Payer: Self-pay | Admitting: Family Medicine

## 2019-12-26 ENCOUNTER — Ambulatory Visit (INDEPENDENT_AMBULATORY_CARE_PROVIDER_SITE_OTHER): Payer: Federal, State, Local not specified - PPO | Admitting: Family Medicine

## 2019-12-26 ENCOUNTER — Encounter: Payer: Self-pay | Admitting: Family Medicine

## 2019-12-26 ENCOUNTER — Telehealth: Payer: Self-pay | Admitting: Family Medicine

## 2019-12-26 ENCOUNTER — Other Ambulatory Visit: Payer: Self-pay

## 2019-12-26 DIAGNOSIS — G40909 Epilepsy, unspecified, not intractable, without status epilepticus: Secondary | ICD-10-CM | POA: Insufficient documentation

## 2019-12-26 DIAGNOSIS — G44319 Acute post-traumatic headache, not intractable: Secondary | ICD-10-CM | POA: Diagnosis not present

## 2019-12-26 DIAGNOSIS — S060X0A Concussion without loss of consciousness, initial encounter: Secondary | ICD-10-CM

## 2019-12-26 NOTE — Telephone Encounter (Signed)
E-scribed refill.  Plz schedule CPE and lab visits. 

## 2019-12-26 NOTE — Telephone Encounter (Signed)
Called patient and he stated it would be easier to schedule appointment when he comes in office next week.

## 2019-12-26 NOTE — Telephone Encounter (Signed)
Noted  

## 2019-12-26 NOTE — Progress Notes (Signed)
VIRTUAL VISIT Due to national recommendations of social distancing due to [place] 19, a virtual visit is felt to be most appropriate for this patient at this time.   I connected with the patient on 12/26/19 at 11:40 AM EDT by virtual telehealth platform and verified that I am speaking with the correct person using two identifiers.   I discussed the limitations, risks, security and privacy concerns of performing an evaluation and management service by  virtual telehealth platform and the availability of in person appointments. I also discussed with the patient that there may be a patient responsible charge related to this service. The patient expressed understanding and agreed to proceed.  Patient location: Home Provider Location: [facility] Participants: Eliezer Lofts and Franki Monte   Chief Complaint  Patient presents with  . Headache    History of Present Illness:  24 year old male pt of Dr. Danise Mina with history of epilepsy presents for frequent headaches.  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  More head injuries in  3 month.no accociated LOC  Currently has bruise over  right eye and right conjunctival hemorrhage ( nml vision in Eye)    Last head injury on 12/23/2019 no LOC He has headaches off and on in last several months.. 2 per month.   Now, He has noted more  frequent headache in last 4 days. Left sided  Forehead pain, 6/10 on pain scale last  2 hours to all day.  Uses tylenol.   Occ nausea and occ emesis with it. No sensitivity to light and sound.  No associated neuro changes.. no vision changes, no slurred speech.  Does have some typing associated numbness in hands but has noted this for months to years.  No weakness.  He has noted some sadness. Going through a break up.  Has been feeling some anxiety.  Some issues falling asleep.   No headache now.  He has not been  drinking coffee lately.. used to drink daily.. wonders if this is contributing to headaches.   Grew out of seizure issue... no longer on Depakote.  COVID 19 screen No recent travel or known exposure to COVID19 The patient denies respiratory symptoms of COVID 19 at this time.  The importance of social distancing was discussed today.   Review of Systems  Constitutional: Negative for chills and fever.  HENT: Negative for congestion and ear pain.   Eyes: Negative for pain and redness.  Respiratory: Negative for cough and shortness of breath.   Cardiovascular: Negative for chest pain, palpitations and leg swelling.  Gastrointestinal: Positive for nausea. Negative for abdominal pain, blood in stool, constipation, diarrhea and vomiting.  Genitourinary: Negative for dysuria.  Musculoskeletal: Negative for falls and myalgias.  Skin: Negative for rash.  Neurological: Positive for sensory change and headaches. Negative for dizziness, tingling, tremors, speech change, focal weakness, seizures, loss of consciousness and weakness.  Psychiatric/Behavioral: Negative for depression, memory loss, substance abuse and suicidal ideas. The patient has insomnia. The patient is not nervous/anxious.       Past Medical History:  Diagnosis Date  . Asthma   . Reflux   . Seizures ([place])    None in many years, no meds for years    reports that he has never smoked. He has never used smokeless tobacco. He reports current alcohol use. He reports that he does not use drugs.   Current Outpatient Medications:  .  acetaminophen-codeine (TYLENOL #3) 300-30 MG tablet, Take 1 tablet by mouth every 6 (six) hours as needed., Disp: , Rfl:  .  Cetirizine HCl (ZYRTEC ALLERGY) 10 MG CAPS, Take 1 capsule (10 mg total) by mouth daily., Disp: , Rfl:  .  fluticasone (FLONASE) 50 MCG/ACT nasal spray, Place 1 spray into both nostrils daily., Disp: 16 g, Rfl: 3 .  fluticasone furoate-vilanterol (BREO ELLIPTA) 100-25 MCG/INH AEPB,  Inhale 1 puff into the lungs daily., Disp: 28 each, Rfl: 6 .  ibuprofen (ADVIL,MOTRIN) 600 MG tablet, Take 600 mg by mouth every 6 (six) hours as needed., Disp: , Rfl:  .  levalbuterol (XOPENEX) 1.25 MG/3ML nebulizer solution, Take 1.25 mg by nebulization every 6 (six) hours as needed for wheezing or shortness of breath., Disp: 72 mL, Rfl: 1 .  meloxicam (MOBIC) 15 MG tablet, Take 1 tablet (15 mg total) by mouth daily as needed for pain., Disp: 30 tablet, Rfl: 0 .  sodium chloride (OCEAN) 0.65 % SOLN nasal spray, Place 2 sprays into both nostrils every 2 (two) hours while awake., Disp: , Rfl: 0 .  albuterol (VENTOLIN HFA) 108 (90 Base) MCG/ACT inhaler, INHALE 2 PUFFS BY MOUTH EVERY 4 HOURS AS NEEDED FOR WHEEZE OR FOR SHORTNESS OF BREATH, Disp: 8.5 g, Rfl: 1   Observations/Objective: There were no vitals taken for this visit.  Physical Exam  Physical Exam Constitutional:      General: The patient is not in acute distress. Bruising around right eye, EOMI, right conjunctival hemorrhage Pulmonary:     Effort: Pulmonary effort is normal. No respiratory distress.  Neurological:     Mental Status: The patient is alert and oriented to person, place, and time.  Psychiatric:        Mood and Affect: Mood normal.        Behavior: Behavior normal.  Neuro: grossly normal movement, gait  Assessment and Plan   Acute post traumatic headache in patient with likely recurrent concussion from martial arts. Encouraged patient to do COMPLETE brain rest until  No headache with light brain use. Start light physical activity only when no headache associated with reading, computer, TV etc.  MRI likely needed for full brain eval but will start with head CT eval given new recent worsening of headaches following trauma to head 3 days ago.  No red flags per pt report, but given unable to examine in person reviewed ER precautions  Needs further specialist evaluation and recommendations for return to activity.. refer  to sports medicine. Dr. Patsy Lager. for in- person exam and follow up. I discussed the assessment and treatment plan with the patient. The patient was provided an opportunity to ask questions and all were answered. The patient agreed with the plan and demonstrated an understanding of the instructions.   The patient was advised to call back or seek an in-person evaluation if the symptoms worsen or if the condition fails to improve as anticipated.     Kerby Nora, MD

## 2019-12-27 ENCOUNTER — Other Ambulatory Visit: Payer: Self-pay

## 2019-12-27 ENCOUNTER — Ambulatory Visit (INDEPENDENT_AMBULATORY_CARE_PROVIDER_SITE_OTHER)
Admission: RE | Admit: 2019-12-27 | Discharge: 2019-12-27 | Disposition: A | Discharge status: Home / Self Care | Payer: Federal, State, Local not specified - PPO | Source: Ambulatory Visit | Attending: Family Medicine | Admitting: Family Medicine

## 2019-12-27 DIAGNOSIS — S060X0A Concussion without loss of consciousness, initial encounter: Secondary | ICD-10-CM | POA: Diagnosis not present

## 2019-12-27 DIAGNOSIS — G44319 Acute post-traumatic headache, not intractable: Secondary | ICD-10-CM | POA: Diagnosis not present

## 2019-12-27 DIAGNOSIS — G44309 Post-traumatic headache, unspecified, not intractable: Secondary | ICD-10-CM | POA: Diagnosis not present

## 2019-12-30 NOTE — Progress Notes (Signed)
Appointment 4/7 pt aware °

## 2019-12-31 DIAGNOSIS — F4325 Adjustment disorder with mixed disturbance of emotions and conduct: Secondary | ICD-10-CM | POA: Diagnosis not present
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
## 2020-01-01 ENCOUNTER — Encounter: Payer: Self-pay | Admitting: Family Medicine

## 2020-01-01 ENCOUNTER — Ambulatory Visit: Payer: Federal, State, Local not specified - PPO | Admitting: Family Medicine

## 2020-01-01 ENCOUNTER — Other Ambulatory Visit: Payer: Self-pay

## 2020-01-01 VITALS — BP 120/60 | HR 67 | Temp 98.7°F | Ht 76.25 in | Wt 242.5 lb

## 2020-01-01 DIAGNOSIS — H1131 Conjunctival hemorrhage, right eye: Secondary | ICD-10-CM

## 2020-01-01 DIAGNOSIS — F0781 Postconcussional syndrome: Secondary | ICD-10-CM | POA: Diagnosis not present

## 2020-01-01 DIAGNOSIS — S069X0A Unspecified intracranial injury without loss of consciousness, initial encounter: Secondary | ICD-10-CM

## 2020-01-01 NOTE — Patient Instructions (Addendum)
POST-CONCUSSIVE SYNDROME:  To help reduce HEADACHES: 1. Coenzyme Q10 160mg  ONCE DAILY 2. Riboflavin/Vitamin B2 400mg  ONCE DAILY 3. Magnesium oxide 400mg  ONCE - TWICE DAILY May stop after headaches are resolved.                                                                                               To help with INSOMNIA: 1. Melatonin 5 - 10 mg AT BEDTIME 2. Tart cherry extract, any dose at night (works well for gout)   Other medicines to help decrease inflammation 1. Turmeric 500mg  (CURCUMIN) once to twice daily      TREATMENT:  THE MOST IMPORTANT THING IS REST AS SOON AS POSSIBLE AFTER INJURY SO THAT THE BRAIN CAN RECOVER. COMPLETE PHYSICAL AND MENTAL REST ARE NEEDED INITIALLY.   THAT MEANS: NO SCHOOL OR WORK FOR AT LEAST 3 DAYS AND CLEARED BY YOUR DOCTOR NO MENTAL EXERTION, MEANING NO WORK, NO HOMEWORK, NO TEST TAKING.  Avoid situations with loud noise, bright lights, or crowds. However, this doesn't mean isolate yourself in a dark room for a week. Too much isolation and boredom can be harmful, contributing to feelings of anxiety, depression, and resulting in increased recovery time. Spend time with friends and family, but monitor your symptoms and avoid situations that make you feel worse.   NO VIDEO GAMES, NO USING THE COMPUTER, NO TEXTING, NO USING SMARTPHONES, NO USE OF AN IPAD OR TABLET. DO NOT GO TO A MOVIE THEATRE OR WATCH SPORTS ON TV. HDTV TENDS TO MAKE PEOPLE FEEL WORSE.   ON APPROXIMATELY DAY 4, BEGIN VERY LIGHT EXERCISE SUCH AS WALKING. DO NOT START ANY STRENUOUS EXERCISE.   You will be given return to school or return to work recommendations.  Mild Traumatic Brain Injury (CONCUSSSION):  Owens Loffler, MD, [facility] Adapted from [employer], [employer], and [employer]  Think of the human brain almost as an egg yolk and your skull as an egg shell. When your head or body takes a hit, it can cause your brain to shake  around inside your skull, injuring the brain. A concussion is not only caused by a hit to the head, but can also be caused by an impact to the body that ends up shaking your brain in your skull, such as whiplash.  A common misconception about concussion is that one loses consciousness. However, loss of consciousness occurs in only less than 10% of concussion cases.  Concussive symptoms typically resolve in 7 to 10 days (sports-related concussions) or within 3 months (non-athletes).  Approximately 20% of people have symptoms after 6 weeks.  With concussions, we have learned that it generally takes youth longer to recover from concussions. Another thing that we now know about concussions is that it usually takes male athletes longer to recover than male athletes.  No two concussions are identical. In fact, there are at least six different clinical trajectories that concussions may take.  Signs and Symptoms of Concussion:  The signs and symptoms of a concussion are incredibly important because a concussion doesn't show up on imaging like an x-ray, CT, or  MRI scan and there is no objective test, like a blood or saliva test, that can determine if a patient has a concussion. A doctor makes a concussion diagnosis based on the results of a comprehensive examination, which includes observing signs of concussion and patients reporting symptoms of concussion appearing after an impact to the head or body. Concussion signs and symptoms are the brain's way of showing it is injured and not functioning normally.  CONCUSSION SIGNS  Concussion signs are what someone could observe about you to determine if you have a concussion.   Common concussion signs include:  Loss of consciousness Problems with balance Glazed look in the eyes Amnesia Delayed response to questions Forgetting an instruction, confusion about an assignment or position, or  confusion of the game, score, or opponent Inappropriate  crying Inappropriate laughter Vomiting  CONCUSSION SYMPTOMS  Concussion symptoms are what someone who is concussed will tell you that they are experiencing. Concussion symptoms typically fall into six major categories:  1- Somatic (Physical) Symptoms  Headache Light-headedness Dizziness Nausea Sensitivity to light Sensitivity to noise  2- Cognitive Symptoms  Difficulties with attention Memory problems Loss of focus Difficulty multitasking Difficulty completing mental tasks  3- Sleep Symptoms  Sleeping more than usual Sleeping less than usual Having trouble falling asleep  4- Emotional Symptoms  Anxiety Depression Panic attacks  5 - Vestibular Symptoms  The vestibular system is affected in nearly 60% of youth and adolescent athletes following a concussion.  But what is the vestibular system?  It's the sensory system that helps with your sense of balance and spatial orientation. Think of a gyroscope! With help from your inner ears, your vestibular system detects the motion or position of your head in space. It sends information to your brain that's needed for balance and stable vision.  Need an example? If you're moving and looking at moving objects at the same time (think riding in a car), you're able to stay focused and not lose visual clarity. During a vestibular concussion, your gyroscope isn't working at full potential.  Difficulty with balance Dizziness. It may feel like the room is spinning or a slow, wavy sensation. (Like you're on a boat!) Trouble stabilizing vision when moving your head. (We call this Vestibular-Ocular Reflux, or VOR.) Think back to riding in a car. With a vestibular concussion, you can't stay focused. (The technical name for this is Visual Motion Sensitivity, or VMS.) Triggers  These 3 things could bring on vestibular symptoms:  Dynamic movements Busy environments, like the grocery store Crowds  6 - Oculo-motor  A concussion  that affects the ocular, or visual, system of the brain. Typically, patients with ocular-motor concussions report pressure headaches in the front of their head, feeling more tired than normal, and becoming more symptomatic doing math or science exercises at school.  Patients also experience difficulties with their eyes working together. Follow along to explore some of the most common symptoms.  Convergence  The eyes converge when viewing objects up close, such as with reading. With convergence problems, patients may see a double image as a target moves closer to them. Typically, without a concussion, objects can be brought very close without doubling.  Accommodation  Accommodation problems cause an object to become blurry as it is viewed up close. Accommodative and convergence problems are often experienced together and can impact reading and other near-vision activities.  Pursuits and Saccades  The eyes use pursuit eye movements to follow objects; while saccade eyes movements allow the eyes  to shift rapidly from one object to another. Tracking objects, reading a book, scrolling on a computer or even watching for a moving car while crossing the street can be difficult when patients have problems with pursuit and saccade eye movements.  Misalignment  When people with eye misalignment (one eye drifts, eyes aren't perfectly aligned) sustain a concussion, the brain may have difficulty compensating for the misalignment like it once did. This can result in blurry vision, difficulty taking notes in class and focusing on the chalkboard.  Note: This is not an exhaustive list of concussion signs and symptoms, and it may take a few days for concussion symptoms to appear after the initial injury.

## 2020-01-07 DIAGNOSIS — F4325 Adjustment disorder with mixed disturbance of emotions and conduct: Secondary | ICD-10-CM | POA: Diagnosis not present

## 2020-01-13 ENCOUNTER — Other Ambulatory Visit: Payer: Self-pay | Admitting: Family Medicine

## 2020-01-14 DIAGNOSIS — F4325 Adjustment disorder with mixed disturbance of emotions and conduct: Secondary | ICD-10-CM | POA: Diagnosis not present

## 2020-01-15 ENCOUNTER — Encounter: Payer: Self-pay | Admitting: Family Medicine

## 2020-01-15 ENCOUNTER — Ambulatory Visit (INDEPENDENT_AMBULATORY_CARE_PROVIDER_SITE_OTHER)
Admission: RE | Admit: 2020-01-15 | Discharge: 2020-01-15 | Disposition: A | Discharge status: Home / Self Care | Payer: Federal, State, Local not specified - PPO | Source: Ambulatory Visit | Attending: Family Medicine | Admitting: Family Medicine

## 2020-01-15 ENCOUNTER — Other Ambulatory Visit: Payer: Self-pay

## 2020-01-15 ENCOUNTER — Ambulatory Visit: Payer: Federal, State, Local not specified - PPO | Admitting: Family Medicine

## 2020-01-15 VITALS — BP 130/82 | HR 84 | Temp 98.5°F | Ht 76.25 in | Wt 244.2 lb

## 2020-01-15 DIAGNOSIS — M25562 Pain in left knee: Secondary | ICD-10-CM

## 2020-01-15 DIAGNOSIS — M25462 Effusion, left knee: Secondary | ICD-10-CM | POA: Diagnosis not present

## 2020-01-15 DIAGNOSIS — S069X0A Unspecified intracranial injury without loss of consciousness, initial encounter: Secondary | ICD-10-CM | POA: Diagnosis not present

## 2020-01-15 NOTE — Patient Instructions (Signed)
REFERRALS TO SPECIALISTS, SPECIAL TESTS (MRI, CT, ULTRASOUNDS)  [facility] or  [facility] will help you.   During the  Covid-19 outbreak we are not having people meet with the patient care coordinators for their protection.  They will call you when your appointment is made.   In the most extreme case (like a stroke), I will try to get them to talk to you in the office, but some insurances require paperwork and authorizations.  This will always take some time.  Specialist appointment times vary a great deal, based on their schedule / openings. -- Some specialists have very long wait times. (Example. Dermatology)    
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
## 2020-01-16 ENCOUNTER — Telehealth: Payer: Self-pay

## 2020-01-16 NOTE — Telephone Encounter (Signed)
Did you by chance call patient yesterday about his MRI appointment?

## 2020-01-16 NOTE — Telephone Encounter (Signed)
[place] [facility] Night - Client Nonclinical Telephone Record AccessNurse Client [place] [facility] Night - Client Client Site [facility] [place] - Night Physician Hannah Beat - MD Contact Type Call Who Is Calling Patient / Member / Family / Caregiver Caller Name Natthew Marlatt Caller Phone Number [phone] Call Type Message Only Information Provided Reason for Call Returning a Call from the Office Initial Comment Caller returning a call from the office Additional Comment Provided office hours Disp. Time Disposition Final User 01/15/2020 5:12:12 PM General Information Provided Yes Jeremy Johann Call Closed By: Jeremy Johann Transaction Date/Time: 01/15/2020 5:10:11 PM (ET)

## 2020-01-16 NOTE — Telephone Encounter (Signed)
Pt was called and given phone# to [facility] to call to schedule MRI.  Pt stated he will call to schedule. We are unable to schedule for pt per [facility]. They require pt to call d/t questions they have regardig metal in body etc.

## 2020-01-21 DIAGNOSIS — F4325 Adjustment disorder with mixed disturbance of emotions and conduct: Secondary | ICD-10-CM | POA: Diagnosis not present

## 2020-01-22 DIAGNOSIS — M25462 Effusion, left knee: Secondary | ICD-10-CM | POA: Diagnosis not present

## 2020-01-28 ENCOUNTER — Telehealth: Payer: Self-pay

## 2020-01-28 DIAGNOSIS — F4325 Adjustment disorder with mixed disturbance of emotions and conduct: Secondary | ICD-10-CM | POA: Diagnosis not present

## 2020-01-28 NOTE — Telephone Encounter (Signed)
See second phone note.  Patient changed his mind.  He will call [facility] and schedule MRI.

## 2020-01-28 NOTE — Telephone Encounter (Signed)
Pt called back he changed his mind and would like to do MRI.  He is call [facility] now to schedule.

## 2020-01-28 NOTE — Telephone Encounter (Signed)
Per pt he does not want to do MRI of his knee (left).  He would like to cancel this request.

## 2020-02-04 DIAGNOSIS — F4325 Adjustment disorder with mixed disturbance of emotions and conduct: Secondary | ICD-10-CM | POA: Diagnosis not present

## 2020-02-18 DIAGNOSIS — F4325 Adjustment disorder with mixed disturbance of emotions and conduct: Secondary | ICD-10-CM | POA: Diagnosis not present

## 2020-02-21 ENCOUNTER — Ambulatory Visit
Admission: RE | Admit: 2020-02-21 | Discharge: 2020-02-21 | Disposition: A | Discharge status: Home / Self Care | Payer: Federal, State, Local not specified - PPO | Source: Ambulatory Visit | Attending: Family Medicine | Admitting: Family Medicine

## 2020-02-21 ENCOUNTER — Other Ambulatory Visit: Payer: Self-pay

## 2020-02-21 DIAGNOSIS — M25462 Effusion, left knee: Secondary | ICD-10-CM

## 2020-02-21 DIAGNOSIS — M25562 Pain in left knee: Secondary | ICD-10-CM

## 2020-02-21 DIAGNOSIS — S83512A Sprain of anterior cruciate ligament of left knee, initial encounter: Secondary | ICD-10-CM | POA: Diagnosis not present

## 2020-02-21 DIAGNOSIS — S83242A Other tear of medial meniscus, current injury, left knee, initial encounter: Secondary | ICD-10-CM | POA: Diagnosis not present

## 2020-03-02 ENCOUNTER — Other Ambulatory Visit: Payer: Self-pay | Admitting: Family Medicine

## 2020-03-02 ENCOUNTER — Telehealth: Payer: Self-pay

## 2020-03-02 DIAGNOSIS — S83242A Other tear of medial meniscus, current injury, left knee, initial encounter: Secondary | ICD-10-CM

## 2020-03-02 DIAGNOSIS — S83281A Other tear of lateral meniscus, current injury, right knee, initial encounter: Secondary | ICD-10-CM

## 2020-03-02 DIAGNOSIS — S83512A Sprain of anterior cruciate ligament of left knee, initial encounter: Secondary | ICD-10-CM

## 2020-03-02 NOTE — Telephone Encounter (Signed)
discussed

## 2020-03-02 NOTE — Progress Notes (Signed)
Consult [facility]

## 2020-03-02 NOTE — Telephone Encounter (Signed)
Patient returned phone call regarding most recent imaging results on 02/21/20.  Per Dr. Patsy Lager: His knee is hurt pretty bad. He ruptured his ACL, and he also tore his meniscus medially and laterally. (inside and outside)  He is going to need to see one of the surgeons. If he wants to talk more, then I can call after work. If he has a particular group or surgeon that he wants to see then let me know.    Patient states he does not have a Equ[facility] trader he would like to go to, but he would like to speak with Dr. Patsy Lager in more detail about this.  Dr. Patsy Lager would you want to give patient a call? Or should I schedule him for an appt? Best call back is [phone]

## 2020-03-03 DIAGNOSIS — F4325 Adjustment disorder with mixed disturbance of emotions and conduct: Secondary | ICD-10-CM | POA: Diagnosis not present

## 2020-03-10 DIAGNOSIS — F4325 Adjustment disorder with mixed disturbance of emotions and conduct: Secondary | ICD-10-CM | POA: Diagnosis not present

## 2020-03-11 ENCOUNTER — Other Ambulatory Visit: Payer: Self-pay | Admitting: Family Medicine

## 2020-03-11 ENCOUNTER — Other Ambulatory Visit: Payer: Self-pay

## 2020-03-12 ENCOUNTER — Ambulatory Visit: Payer: Federal, State, Local not specified - PPO | Admitting: Family Medicine

## 2020-03-12 ENCOUNTER — Encounter: Payer: Self-pay | Admitting: Family Medicine

## 2020-03-12 VITALS — BP 138/76 | HR 71 | Temp 98.4°F | Ht 76.25 in | Wt 235.5 lb

## 2020-03-12 DIAGNOSIS — Z113 Encounter for screening for infections with a predominantly sexual mode of transmission: Secondary | ICD-10-CM

## 2020-03-12 DIAGNOSIS — S8992XD Unspecified injury of left lower leg, subsequent encounter: Secondary | ICD-10-CM

## 2020-03-12 DIAGNOSIS — S8992XA Unspecified injury of left lower leg, initial encounter: Secondary | ICD-10-CM | POA: Insufficient documentation

## 2020-03-12 NOTE — Assessment & Plan Note (Addendum)
Discussed abstinence, safe sex measures.

## 2020-03-12 NOTE — Patient Instructions (Addendum)
See one of our referral coordinators before you leave today.  STD screen today.  Consider COVID vaccines.

## 2020-03-12 NOTE — Assessment & Plan Note (Signed)
Still pending ortho eval. Doesn't think he would want MRI as he is able to function pretty well on that knee (even running and training on it), however has stopped wrestling. I do think ortho eval is important for recommendations given complexity of injury.

## 2020-03-12 NOTE — Progress Notes (Signed)
This visit was conducted in person.  BP 138/76 (BP Location: Right Arm, Patient Position: Sitting, Cuff Size: Large)   Pulse 71   Temp 98.4 F (36.9 C) (Temporal)   Ht 6' 4.25" (1.937 m)   Wt 235 lb 8 oz (106.8 kg)   SpO2 98%   BMI 28.48 kg/m    CC: STD screen Subjective:    Patient ID: Jordan Ferguson, male    DOB: 25-Apr-1996, 24 y.o.   MRN: [id number]  HPI: Jordan Ferguson is a 24 y.o. male presenting on 03/12/2020 for STD Testing (Here for regular STD testing.  Denies any sxs or exposure. )   Has been acting during pandemic.  Recent R ACL rupture while wrestling pending ortho appt. First injury 08/2019, has reinured twice.   Requests STD screening - 2 partners in the past year. Protection 85% of time.   Denies urethral discharge, dysuria, groin rash.  No fevers/chills.   COVID vaccine - still considering      Relevant past medical, surgical, family and social history reviewed and updated as indicated. Interim medical history since our last visit reviewed. Allergies and medications reviewed and updated. Outpatient Medications Prior to Visit  Medication Sig Dispense Refill  . acetaminophen-codeine (TYLENOL #3) 300-30 MG tablet Take 1 tablet by mouth every 6 (six) hours as needed.    Marland Kitchen albuterol (VENTOLIN HFA) 108 (90 Base) MCG/ACT inhaler INHALE 2 PUFFS BY MOUTH EVERY 4 HOURS AS NEEDED FOR WHEEZE OR FOR SHORTNESS OF BREATH 8.5 g 1  . BREO ELLIPTA 100-25 MCG/INH AEPB TAKE 1 PUFF BY MOUTH EVERY DAY 60 each 0  . Cetirizine HCl (ZYRTEC ALLERGY) 10 MG CAPS Take 1 capsule (10 mg total) by mouth daily.    . fluticasone (FLONASE) 50 MCG/ACT nasal spray Place 1 spray into both nostrils daily. 16 g 3  . ibuprofen (ADVIL,MOTRIN) 600 MG tablet Take 600 mg by mouth every 6 (six) hours as needed.    . levalbuterol (XOPENEX) 1.25 MG/3ML nebulizer solution Take 1.25 mg by nebulization every 6 (six) hours as needed for wheezing or shortness of breath. 72 mL 1  . meloxicam  (MOBIC) 15 MG tablet Take 1 tablet (15 mg total) by mouth daily as needed for pain. 30 tablet 0  . sodium chloride (OCEAN) 0.65 % SOLN nasal spray Place 2 sprays into both nostrils every 2 (two) hours while awake.  0   No facility-administered medications prior to visit.     Per HPI unless specifically indicated in ROS section below Review of Systems Objective:  BP 138/76 (BP Location: Right Arm, Patient Position: Sitting, Cuff Size: Large)   Pulse 71   Temp 98.4 F (36.9 C) (Temporal)   Ht 6' 4.25" (1.937 m)   Wt 235 lb 8 oz (106.8 kg)   SpO2 98%   BMI 28.48 kg/m   Wt Readings from Last 3 Encounters:  03/12/20 235 lb 8 oz (106.8 kg)  01/15/20 244 lb 4 oz (110.8 kg)  01/01/20 242 lb 8 oz (110 kg)      Physical Exam Vitals and nursing note reviewed.  Constitutional:      Appearance: Normal appearance. He is not ill-appearing.  HENT:     Mouth/Throat:     Mouth: Mucous membranes are dry.  Cardiovascular:     Rate and Rhythm: Normal rate and regular rhythm.     Pulses: Normal pulses.     Heart sounds: Normal heart sounds. No murmur heard.   Pulmonary:  Effort: Pulmonary effort is normal. No respiratory distress.     Breath sounds: Normal breath sounds. No wheezing, rhonchi or rales.  Neurological:     Mental Status: He is alert.  Psychiatric:        Mood and Affect: Mood normal.        Behavior: Behavior normal.       Results for orders placed or performed during the hospital encounter of 10/16/19  Novel Coronavirus, NAA (Hosp order, Send-out to Ref Lab; TAT 18-24 hrs   Specimen: Nasopharyngeal Swab; Respiratory  Result Value Ref Range   SARS-CoV-2, NAA NOT DETECTED NOT DETECTED   Coronavirus Source NASOPHARYNGEAL    MR Knee Left  Wo Contrast CLINICAL DATA:  Knee instability. Several months of swelling and pain.  EXAM: MRI OF THE LEFT KNEE WITHOUT CONTRAST  TECHNIQUE: Multiplanar, multisequence MR imaging of the knee was performed. No intravenous  contrast was administered.  COMPARISON:  None.  FINDINGS: MENISCI  Medial meniscus: Radial tear of the posterior horn of the medial meniscus at the meniscal root.  Lateral meniscus: Radial tear of the posterior horn and body of the lateral meniscus.  LIGAMENTS  Cruciates: Complete tear of the anterior cruciate ligament. Intact posterior cruciate ligament.  Collaterals: Medial collateral ligament is intact. Lateral collateral ligament complex is intact.  CARTILAGE  Patellofemoral:  No focal chondral defect.  Medial:  No focal chondral defect.  Lateral:  No chondral defect.  Joint: Moderate joint effusion. No plical thickening. Mild edema in the infrapatellar Hoffa's fat.  Popliteal Fossa:  No Baker cyst. Intact popliteus tendon.  Extensor Mechanism: Intact quadriceps tendon. Intact patellar tendon. Intact medial patellar retinaculum. Intact lateral patellar retinaculum. Intact MPFL.  Bones: Osseous contusions of the posterolateral tibial plateau, anterolateral femoral condyle and posteromedial tibial plateau.  Other: No fluid collection or hematoma. Muscles are normal.  IMPRESSION: 1. Complete tear of the ACL. 2. Radial tear of the posterior horn of the medial meniscus at the meniscal root. 3. Radial tear of the posterior horn and body of the lateral meniscus. 4. Osseous contusions of the posterolateral tibial plateau, anterolateral femoral condyle and posteromedial tibial plateau.  Electronically Signed   By: Elige Ko   On: 02/22/2020 08:00   Assessment & Plan:  This visit occurred during the SARS-CoV-2 public health emergency.  Safety protocols were in place, including screening questions prior to the visit, additional usage of staff PPE, and extensive cleaning of exam room while observing appropriate contact time as indicated for disinfecting solutions.   Problem List Items Addressed This Visit    Screen for STD (sexually transmitted disease)     Discussed abstinence, safe sex measures.       Relevant Orders   HIV Antibody (routine testing w rflx)   RPR   C. trachomatis/N. gonorrhoeae RNA   Left knee injury - Primary    Still pending ortho eval. Doesn't think he would want MRI as he is able to function pretty well on that knee (even running and training on it), however has stopped wrestling. I do think ortho eval is important for recommendations given complexity of injury.           No orders of the defined types were placed in this encounter.  Orders Placed This Encounter  Procedures  . C. trachomatis/N. gonorrhoeae RNA  . HIV Antibody (routine testing w rflx)  . RPR    Patient Instructions  See one of our referral coordinators before you leave today.  STD screen  today.  Consider COVID vaccines.   Follow up plan: Return if symptoms worsen or fail to improve.  Eustaquio Boyden, MD

## 2020-03-13 LAB — RPR: RPR Ser Ql: NONREACTIVE

## 2020-03-13 LAB — C. TRACHOMATIS/N. GONORRHOEAE RNA
C. trachomatis RNA, TMA: NOT DETECTED
N. gonorrhoeae RNA, TMA: NOT DETECTED

## 2020-03-13 LAB — HIV ANTIBODY (ROUTINE TESTING W REFLEX): HIV 1&2 Ab, 4th Generation: NONREACTIVE

## 2020-03-29 ENCOUNTER — Other Ambulatory Visit: Payer: Self-pay | Admitting: Family Medicine

## 2020-04-06 DIAGNOSIS — S83512A Sprain of anterior cruciate ligament of left knee, initial encounter: Secondary | ICD-10-CM | POA: Diagnosis not present

## 2020-04-06 DIAGNOSIS — M25562 Pain in left knee: Secondary | ICD-10-CM | POA: Diagnosis not present

## 2020-04-06 DIAGNOSIS — S83242A Other tear of medial meniscus, current injury, left knee, initial encounter: Secondary | ICD-10-CM | POA: Diagnosis not present

## 2020-04-06 DIAGNOSIS — M25462 Effusion, left knee: Secondary | ICD-10-CM | POA: Diagnosis not present

## 2020-04-06 DIAGNOSIS — S83232A Complex tear of medial meniscus, current injury, left knee, initial encounter: Secondary | ICD-10-CM | POA: Diagnosis not present

## 2020-04-15 ENCOUNTER — Telehealth: Payer: Self-pay | Admitting: Family Medicine

## 2020-04-15 NOTE — Telephone Encounter (Signed)
Jordan Ferguson, Jordan Ferguson          [address] [place] [address]           Pt called back wanting to get a copy of his labs mailed to the above address

## 2020-04-15 NOTE — Telephone Encounter (Signed)
Patient called to get set up for cpe. He had fasting labs done on 6/17 so he did not want to do labs again. Patient also wanted to a printed out copy of his labs from 6/17 to pick up when he comes in for physical on 05/01/20.

## 2020-04-15 NOTE — Telephone Encounter (Signed)
Results mailed to patient;.

## 2020-04-22 ENCOUNTER — Other Ambulatory Visit: Payer: Self-pay

## 2020-04-22 ENCOUNTER — Ambulatory Visit (INDEPENDENT_AMBULATORY_CARE_PROVIDER_SITE_OTHER): Payer: Federal, State, Local not specified - PPO | Admitting: Family Medicine

## 2020-04-22 ENCOUNTER — Encounter: Payer: Self-pay | Admitting: Family Medicine

## 2020-04-22 VITALS — BP 120/74 | HR 93 | Temp 97.7°F | Ht 76.5 in | Wt 241.0 lb

## 2020-04-22 DIAGNOSIS — Z0279 Encounter for issue of other medical certificate: Secondary | ICD-10-CM

## 2020-04-22 DIAGNOSIS — J301 Allergic rhinitis due to pollen: Secondary | ICD-10-CM | POA: Diagnosis not present

## 2020-04-22 DIAGNOSIS — S8992XD Unspecified injury of left lower leg, subsequent encounter: Secondary | ICD-10-CM

## 2020-04-22 DIAGNOSIS — J4520 Mild intermittent asthma, uncomplicated: Secondary | ICD-10-CM

## 2020-04-22 DIAGNOSIS — Z0000 Encounter for general adult medical examination without abnormal findings: Secondary | ICD-10-CM | POA: Diagnosis not present

## 2020-04-22 MED ORDER — BREO ELLIPTA 100-25 MCG/INH IN AEPB: 1.0000 | INHALATION_SPRAY | Freq: Every day | RESPIRATORY_TRACT | 6 refills | Status: DC | PRN

## 2020-04-22 NOTE — Assessment & Plan Note (Signed)
Discussed zyrtec flonase and nasal saline use.

## 2020-04-22 NOTE — Assessment & Plan Note (Signed)
[employer] sports physical form filled out.  Trying out next month.

## 2020-04-22 NOTE — Patient Instructions (Addendum)
I do recommend COVID vaccine.  Good to see you today.  Return as needed or in 1 year for physical.  Best of luck in [place]!.  Health Maintenance, Male Adopting a healthy lifestyle and getting preventive care are important in promoting health and wellness. Ask your health care provider about:  The right schedule for you to have regular tests and exams.  Things you can do on your own to prevent diseases and keep yourself healthy. What should I know about diet, weight, and exercise? Eat a healthy diet   Eat a diet that includes plenty of vegetables, fruits, low-fat dairy products, and lean protein.  Do not eat a lot of foods that are high in solid fats, added sugars, or sodium. Maintain a healthy weight Body mass index (BMI) is a measurement that can be used to identify possible weight problems. It estimates body fat based on height and weight. Your health care provider can help determine your BMI and help you achieve or maintain a healthy weight. Get regular exercise Get regular exercise. This is one of the most important things you can do for your health. Most adults should:  Exercise for at least 150 minutes each week. The exercise should increase your heart rate and make you sweat (moderate-intensity exercise).  Do strengthening exercises at least twice a week. This is in addition to the moderate-intensity exercise.  Spend less time sitting. Even light physical activity can be beneficial. Watch cholesterol and blood lipids Have your blood tested for lipids and cholesterol at 24 years of age, then have this test every 5 years. You may need to have your cholesterol levels checked more often if:  Your lipid or cholesterol levels are high.  You are older than 24 years of age.  You are at high risk for heart disease. What should I know about cancer screening? Many types of cancers can be detected early and may often be prevented. Depending on your health history and family history,  you may need to have cancer screening at various ages. This may include screening for:  Colorectal cancer.  Prostate cancer.  Skin cancer.  Lung cancer. What should I know about heart disease, diabetes, and high blood pressure? Blood pressure and heart disease  High blood pressure causes heart disease and increases the risk of stroke. This is more likely to develop in people who have high blood pressure readings, are of African descent, or are overweight.  Talk with your health care provider about your target blood pressure readings.  Have your blood pressure checked: ? Every 3-5 years if you are 24-24 years of age. ? Every year if you are 24 years old or older.  If you are between the ages of 43 and 37 and are a current or former smoker, ask your health care provider if you should have a one-time screening for abdominal aortic aneurysm (AAA). Diabetes Have regular diabetes screenings. This checks your fasting blood sugar level. Have the screening done:  Once every three years after age 24 if you are at a normal weight and have a low risk for diabetes.  More often and at a younger age if you are overweight or have a high risk for diabetes. What should I know about preventing infection? Hepatitis B If you have a higher risk for hepatitis B, you should be screened for this virus. Talk with your health care provider to find out if you are at risk for hepatitis B infection. Hepatitis C Blood testing is recommended  for:  Everyone born from [birth year] through 1965.  Anyone with known risk factors for hepatitis C. Sexually transmitted infections (STIs)  You should be screened each year for STIs, including gonorrhea and chlamydia, if: ? You are sexually active and are younger than 24 years of age. ? You are older than 24 years of age and your health care provider tells you that you are at risk for this type of infection. ? Your sexual activity has changed since you were last screened, and  you are at increased risk for chlamydia or gonorrhea. Ask your health care provider if you are at risk.  Ask your health care provider about whether you are at high risk for HIV. Your health care provider may recommend a prescription medicine to help prevent HIV infection. If you choose to take medicine to prevent HIV, you should first get tested for HIV. You should then be tested every 3 months for as long as you are taking the medicine. Follow these instructions at home: Lifestyle  Do not use any products that contain nicotine or tobacco, such as cigarettes, e-cigarettes, and chewing tobacco. If you need help quitting, ask your health care provider.  Do not use street drugs.  Do not share needles.  Ask your health care provider for help if you need support or information about quitting drugs. Alcohol use  Do not drink alcohol if your health care provider tells you not to drink.  If you drink alcohol: ? Limit how much you have to 0-2 drinks a day. ? Be aware of how much alcohol is in your drink. In the [place] one drink equals one 12 oz bottle of beer (355 mL), one 5 oz glass of wine (148 mL), or one 1 oz glass of hard liquor (44 mL). General instructions  Schedule regular health, dental, and eye exams.  Stay current with your vaccines.  Tell your health care provider if: ? You often feel depressed. ? You have ever been abused or do not feel safe at home. Summary  Adopting a healthy lifestyle and getting preventive care are important in promoting health and wellness.  Follow your health care provider's instructions about healthy diet, exercising, and getting tested or screened for diseases.  Follow your health care provider's instructions on monitoring your cholesterol and blood pressure. This information is not intended to replace advice given to you by your health care provider. Make sure you discuss any questions you have with your health care provider. Document Revised:  09/05/2018 Document Reviewed: 09/05/2018 Elsevier Patient Education  2020 [employer].

## 2020-04-22 NOTE — Assessment & Plan Note (Addendum)
Preventative protocols reviewed and updated unless pt declined. Discussed healthy diet and lifestyle.  Encouraged he get COVID vaccine. He is considering.

## 2020-04-22 NOTE — Assessment & Plan Note (Addendum)
Improved. More consistent with mild intermittent asthma. Discussed breo and albuterol use. Triggers are pet dander and pollen/allergic rhinitis.

## 2020-04-22 NOTE — Progress Notes (Signed)
This visit was conducted in person.  BP 120/74 (BP Location: Right Arm, Patient Position: Sitting, Cuff Size: Large)   Pulse 93   Temp 97.7 F (36.5 C)   Ht 6' 4.5" (1.943 m)   Wt (!) 241 lb (109.3 kg)   SpO2 98%   BMI 28.95 kg/m    CC: sports physical  Subjective:    Patient ID: Jordan Ferguson, male    DOB: December 09, 1995, 24 y.o.   MRN: [id number]  HPI: Jordan Cashsaiah Freddie Kurek is a 24 y.o. male presenting on 04/22/2020 for Annual Exam ([facility] [employer] [facility] [facility])   [facility]. Trying out for [employer] in [place].   Asthma - only using PRN albuterol. Triggers are cat and dog hair as well as pollen. Uses breo ellipta infrequently. Also albuterol inh or xopenex neb PRN - last used last night. Also takes zyrtec PRN and flonase PRN for allergic rhinitis flare (worse in fall).   No fmhx premature cardiac death. No h/o heart disease, arrhythmias, palpitations.   Preventative: Flu shot yearly Tdap 2008, declines COVID vaccine - considering Seat belt use discussed. Sunscreen use discussed. No changing moles on skin.  Non smoker  Alcohol - few beers/month Rec drugs - none  Currently sexually active - STD screening last month 02/2020  Lives with mother and brother, 1 dog and rabbit Edu: finished at [employer] Occ: retail, some modeling and acting, drummer in band, some pro-wrestling  Activity: lifts weights at gym, cardio - working out more Diet: some water, fruits/vegetables some     Relevant past medical, surgical, family and social history reviewed and updated as indicated. Interim medical history since our last visit reviewed. Allergies and medications reviewed and updated. Outpatient Medications Prior to Visit  Medication Sig Dispense Refill  . albuterol (VENTOLIN HFA) 108 (90 Base) MCG/ACT inhaler INHALE 2 PUFFS BY MOUTH EVERY 4 HOURS AS NEEDED FOR WHEEZE OR FOR SHORTNESS OF BREATH 18 g 3  . Cetirizine HCl  (ZYRTEC ALLERGY) 10 MG CAPS Take 1 capsule (10 mg total) by mouth daily.    . fluticasone (FLONASE) 50 MCG/ACT nasal spray Place 1 spray into both nostrils daily. 16 g 3  . ibuprofen (ADVIL,MOTRIN) 600 MG tablet Take 600 mg by mouth every 6 (six) hours as needed.    . levalbuterol (XOPENEX) 1.25 MG/3ML nebulizer solution Take 1.25 mg by nebulization every 6 (six) hours as needed for wheezing or shortness of breath. 72 mL 1  . sodium chloride (OCEAN) 0.65 % SOLN nasal spray Place 2 sprays into both nostrils every 2 (two) hours while awake.  0  . acetaminophen-codeine (TYLENOL #3) 300-30 MG tablet Take 1 tablet by mouth every 6 (six) hours as needed.    Marland Kitchen. BREO ELLIPTA 100-25 MCG/INH AEPB TAKE 1 PUFF BY MOUTH EVERY DAY 60 each 0  . meloxicam (MOBIC) 15 MG tablet Take 1 tablet (15 mg total) by mouth daily as needed for pain. 30 tablet 0   No facility-administered medications prior to visit.     Per HPI unless specifically indicated in ROS section below Review of Systems  Constitutional: Negative for activity change, appetite change, chills, fatigue, fever and unexpected weight change.  HENT: Negative for hearing loss.   Eyes: Negative for visual disturbance.  Respiratory: Positive for wheezing. Negative for cough, chest tightness and shortness of breath.   Cardiovascular: Negative for chest pain, palpitations and leg swelling.  Gastrointestinal: Negative for abdominal distention,  abdominal pain, blood in stool, constipation, diarrhea, nausea and vomiting.  Genitourinary: Negative for difficulty urinating and hematuria.  Musculoskeletal: Negative for arthralgias, myalgias and neck pain.  Skin: Negative for rash.  Neurological: Negative for dizziness, seizures, syncope and headaches.  Hematological: Negative for adenopathy. Does not bruise/bleed easily.  Psychiatric/Behavioral: Negative for dysphoric mood. The patient is not nervous/anxious.    Objective:  BP 120/74 (BP Location: Right Arm,  Patient Position: Sitting, Cuff Size: Large)   Pulse 93   Temp 97.7 F (36.5 C)   Ht 6' 4.5" (1.943 m)   Wt (!) 241 lb (109.3 kg)   SpO2 98%   BMI 28.95 kg/m   Wt Readings from Last 3 Encounters:  04/22/20 (!) 241 lb (109.3 kg)  03/12/20 235 lb 8 oz (106.8 kg)  01/15/20 244 lb 4 oz (110.8 kg)      Physical Exam Vitals and nursing note reviewed.  Constitutional:      General: He is not in acute distress.    Appearance: Normal appearance. He is well-developed. He is not ill-appearing.  HENT:     Head: Normocephalic and atraumatic.     Right Ear: Hearing, tympanic membrane, ear canal and external ear normal.     Left Ear: Hearing, tympanic membrane, ear canal and external ear normal.     Mouth/Throat:     Pharynx: Uvula midline.  Eyes:     General: No scleral icterus.    Extraocular Movements: Extraocular movements intact.     Conjunctiva/sclera: Conjunctivae normal.     Pupils: Pupils are equal, round, and reactive to light.  Cardiovascular:     Rate and Rhythm: Normal rate and regular rhythm.     Pulses: Normal pulses.          Radial pulses are 2+ on the right side and 2+ on the left side.     Heart sounds: Normal heart sounds. No murmur heard.   Pulmonary:     Effort: Pulmonary effort is normal. No respiratory distress.     Breath sounds: Normal breath sounds. No wheezing, rhonchi or rales.  Abdominal:     General: Abdomen is flat. Bowel sounds are normal. There is no distension.     Palpations: Abdomen is soft. There is no mass.     Tenderness: There is no abdominal tenderness. There is no guarding or rebound.     Hernia: No hernia is present.  Musculoskeletal:        General: Normal range of motion.     Right shoulder: Normal range of motion.     Left shoulder: Normal range of motion.     Cervical back: Normal, normal range of motion and neck supple.     Thoracic back: Normal.     Lumbar back: Normal.     Right hip: Normal. No tenderness. Normal range of motion.      Left hip: No tenderness. Normal range of motion.     Right lower leg: No edema.     Left lower leg: No edema.     Comments: No thoracolumbar scoliosis  Lymphadenopathy:     Cervical: No cervical adenopathy.  Skin:    General: Skin is warm and dry.     Findings: No rash.  Neurological:     General: No focal deficit present.     Mental Status: He is alert and oriented to person, place, and time.     Comments: CN grossly intact, station and gait intact  Psychiatric:  Mood and Affect: Mood normal.        Behavior: Behavior normal.        Thought Content: Thought content normal.        Judgment: Judgment normal.       Results for orders placed or performed in visit on 03/12/20  C. trachomatis/N. gonorrhoeae RNA   Specimen: Blood  Result Value Ref Range   C. trachomatis RNA, TMA NOT DETECTED NOT DETECT   N. gonorrhoeae RNA, TMA NOT DETECTED NOT DETECT  HIV Antibody (routine testing w rflx)  Result Value Ref Range   HIV 1&2 Ab, 4th Generation NON-REACTIVE NON-REACTI  RPR  Result Value Ref Range   RPR Ser Ql NON-REACTIVE NON-REACTI   Depression screen [facility] 2/9 04/22/2020 01/03/2018 01/27/2016  Decreased Interest 0 0 1  Down, Depressed, Hopeless 0 0 1  PHQ - 2 Score 0 0 2  Altered sleeping - - 2  Tired, decreased energy - - 1  Change in appetite - - 0  Feeling bad or failure about yourself  - - 2  Trouble concentrating - - 2  Moving slowly or fidgety/restless - - 2  Suicidal thoughts - - 0  PHQ-9 Score - - 11  Difficult doing work/chores - - Somewhat difficult    GAD 7 : Generalized Anxiety Score 01/27/2016  Nervous, Anxious, on Edge 2  Control/stop worrying 2  Worry too much - different things 2  Trouble relaxing 2  Restless 1  Easily annoyed or irritable 2  Afraid - awful might happen 2  Total GAD 7 Score 13  Anxiety Difficulty Somewhat difficult   Assessment & Plan:  This visit occurred during the SARS-CoV-2 public health emergency.  Safety protocols were in  place, including screening questions prior to the visit, additional usage of staff PPE, and extensive cleaning of exam room while observing appropriate contact time as indicated for disinfecting solutions.   Problem List Items Addressed This Visit    Seasonal allergic rhinitis    Discussed zyrtec flonase and nasal saline use.       Mild intermittent asthma    Improved. More consistent with mild intermittent asthma. Discussed breo and albuterol use. Triggers are pet dander and pollen/allergic rhinitis.       Relevant Medications   fluticasone furoate-vilanterol (BREO ELLIPTA) 100-25 MCG/INH AEPB   Medical certificate issuance    [employer] physical form filled out.  Trying out next month.       Left knee injury    ADDENDUM==> At office visit patient stated he had suffered L knee strain that was managed well with knee brace when active. He stated was able to be physically active without pain. I filled out forms accordingly.  After patient left - reviewing chart, he had a serious knee injury earlier this year with MRI (01/2020) showing L ACL tear and medial/lateral meniscal tear - he has seen sports medicine and has seen [facility] with recommendation for knee surgery, planned 07/2020.  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 I encouraged he expedite surgical treatment of knee and discussed I need to update his clearance forms to reflect his recent injury history.  I asked and he agrees to bring back forms to addend with new information noted today - he will be out of town this week but will bring back forms on  Monday 05/04/2020 to addend.       Health maintenance examination - Primary    Preventative protocols reviewed and updated unless pt declined. Discussed healthy diet and lifestyle.  Encouraged he  get COVID vaccine. He is considering.         ADDENDUM==>  L KNEE MRI WITHOUT CONTRAST IMPRESSION: 1. Complete tear of the ACL. 2. Radial tear of the posterior horn of the medial meniscus at the meniscal root. 3. Radial tear of the posterior horn and body of the lateral meniscus. 4. Osseous contusions of the posterolateral tibial plateau, anterolateral femoral condyle and posteromedial tibial plateau. Electronically Signed   By: Elige Ko   On: 02/22/2020 08:00  Meds ordered this encounter  Medications  . DISCONTD: fluticasone furoate-vilanterol (BREO ELLIPTA) 100-25 MCG/INH AEPB    Sig: Inhale 1 puff into the lungs daily as needed (dyspnea, wheeze).    Dispense:  60 each    Refill:  6  . fluticasone furoate-vilanterol (BREO ELLIPTA) 100-25 MCG/INH AEPB    Sig: Inhale 1 puff into the lungs daily as needed (dyspnea, wheeze).    Dispense:  28 each    Refill:  6   No orders of the defined types were placed in this encounter.   Patient instructions: I do recommend COVID vaccine.  Good to see you today Return as needed or in 1 year for physical Best of luck in [place]!  Follow up plan: No follow-ups on file.  Eustaquio Boyden, MD

## 2020-04-22 NOTE — Assessment & Plan Note (Signed)
ADDENDUM==> At office visit patient stated he had suffered L knee strain that was managed well with knee brace when active. He stated was able to be physically active without pain. I filled out forms accordingly.  After patient left - reviewing chart, he had a serious knee injury earlier this year with MRI (01/2020) showing L ACL tear and medial/lateral meniscal tear - he has seen sports medicine and has seen [facility] with recommendation for knee surgery, planned 07/2020.  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 I encouraged he expedite surgical treatment of knee and discussed I need to update his clearance forms to reflect his recent injury history.  I asked and he agrees to bring back forms to addend with new information noted today - he will be out of town this week but will bring back forms on Monday 05/04/2020 to addend.

## 2020-05-01 ENCOUNTER — Ambulatory Visit: Payer: Federal, State, Local not specified - PPO | Admitting: Family Medicine

## 2020-05-05 ENCOUNTER — Telehealth: Payer: Self-pay | Admitting: Family Medicine

## 2020-05-05 NOTE — Telephone Encounter (Signed)
Lvm asking pt to call back.  Need to relay Dr. G's message.  

## 2020-05-05 NOTE — Telephone Encounter (Signed)
I called as well, straight to voicemail.

## 2020-05-05 NOTE — Telephone Encounter (Signed)
Please call - patient needs to bring back his [employer] clearance form for a correction regarding knee. He was supposed to drop off yesterday. Needs to return prior to his trip to [place].  We discussed this by phone 2 wks ago.

## 2020-05-06 NOTE — Telephone Encounter (Signed)
Left message with pt's mom, asking him to call our office.  Says she will relay the message.

## 2020-05-06 NOTE — Telephone Encounter (Signed)
I called patient again, straight to voicemail.

## 2020-05-06 NOTE — Telephone Encounter (Signed)
Lvm asking pt to call back.  Need to relay Dr. G's message.  

## 2020-05-06 NOTE — Telephone Encounter (Addendum)
I spoke with patient - he states he was out of town earlier in the week so did not drop off form to addend, but will pass by tomorrow to bring form in.  He states [employer] trip may have fallen through after he was up front about his knee condition, he is waiting to hear about future options to try out for [employer].

## 2020-05-07 NOTE — Telephone Encounter (Signed)
Noted  

## 2020-05-08 DIAGNOSIS — Z0184 Encounter for antibody response examination: Secondary | ICD-10-CM | POA: Diagnosis not present

## 2020-05-08 NOTE — Telephone Encounter (Addendum)
He did not drop off form to be amended. I called and left a message stating we will mail corrected form to his PO box on file and that he should use this form not the original one.  I did ask him to bring back the original form so we can shred.  Amended form placed in Lisa's box.

## 2020-05-11 NOTE — Telephone Encounter (Signed)
Noted.  Mailed form.  Made copy to be scanned.

## 2020-10-09 IMAGING — CT CT HEAD W/O CM
3 of 4 series · 14 of 47 positions shown, 16 images · non-contrast
Comparison: CT head 08/08/2009

CLINICAL DATA: Posttraumatic headache.  Concussion

EXAM:
CT HEAD WITHOUT CONTRAST
TECHNIQUE: Contiguous axial images were obtained from the base of the skull
through the vertex without intravenous contrast.

[Series 2: head 5.0 hc40 · axial · 0.48mm/px · z∈[-71,+59]mm · 8 of 32 slices shown, 10 images]
[im 3/32  brain]
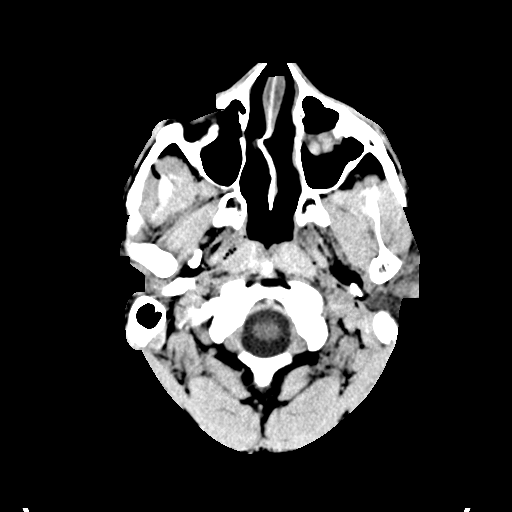
[im 3/32  bone]
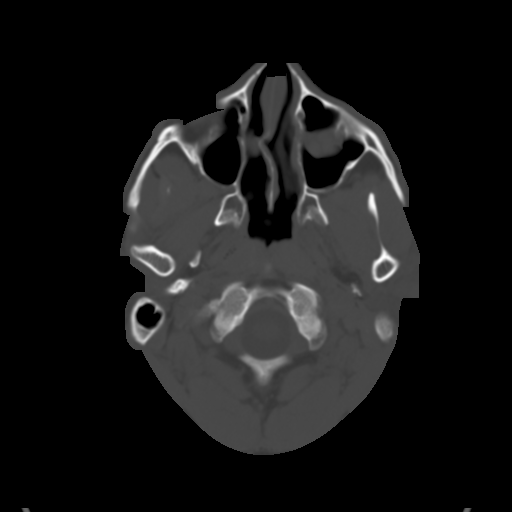
[im 7/32  brain]
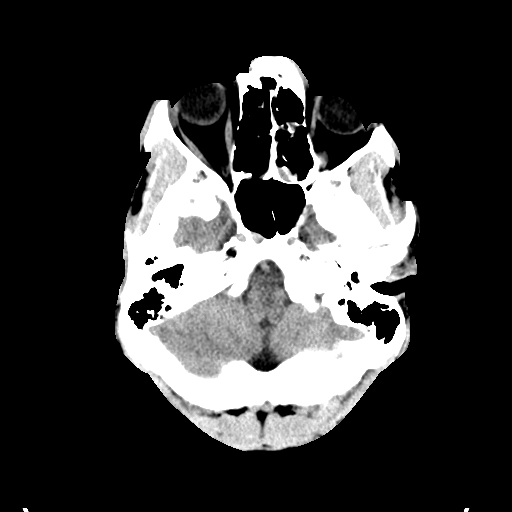
[im 12/32  brain]
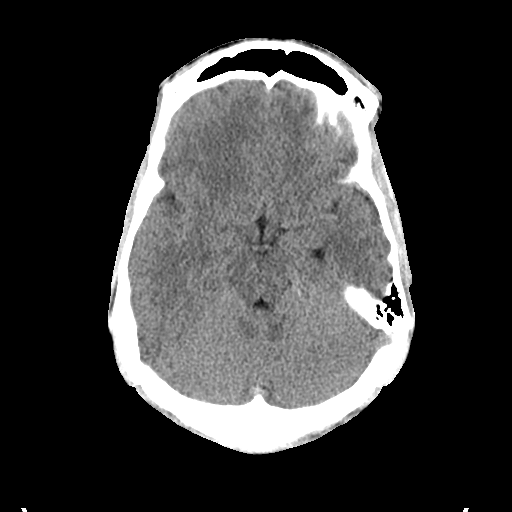
[im 14/32  brain]
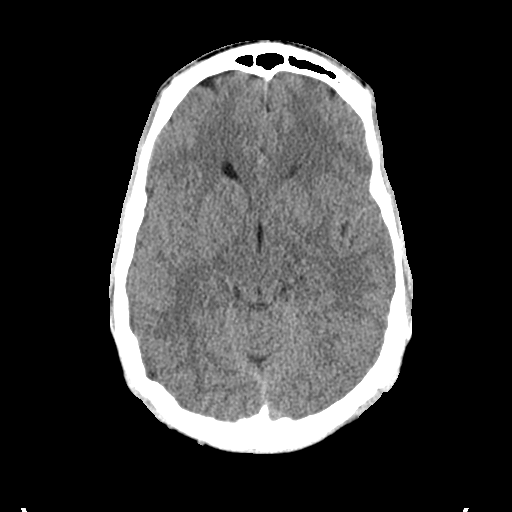
[im 18/32  brain]
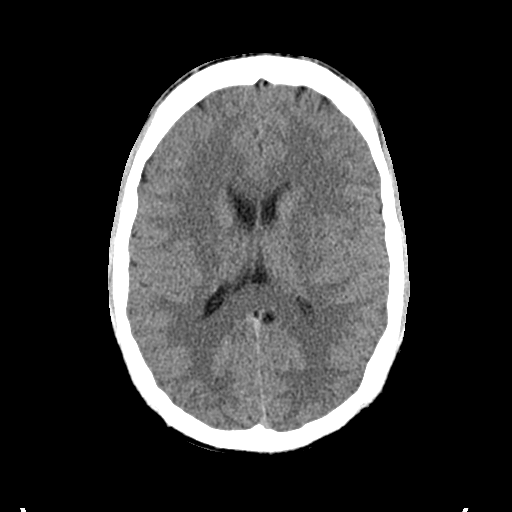
[im 18/32  bone]
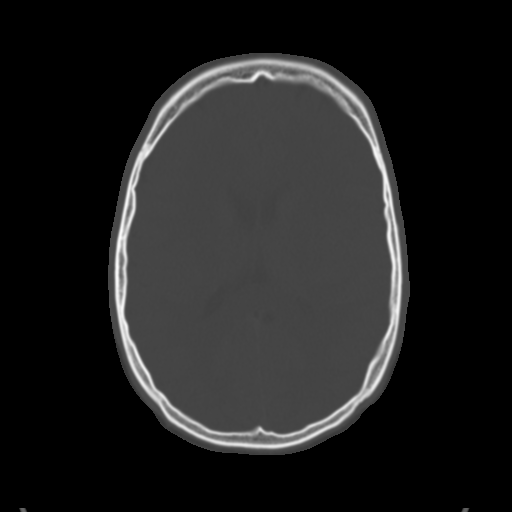
[im 20/32  brain]
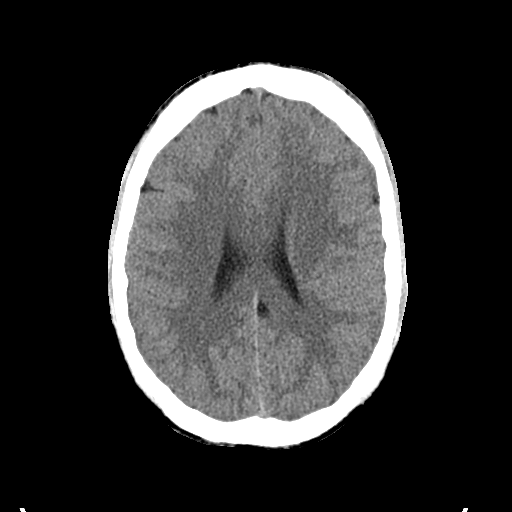
[im 25/32  brain]
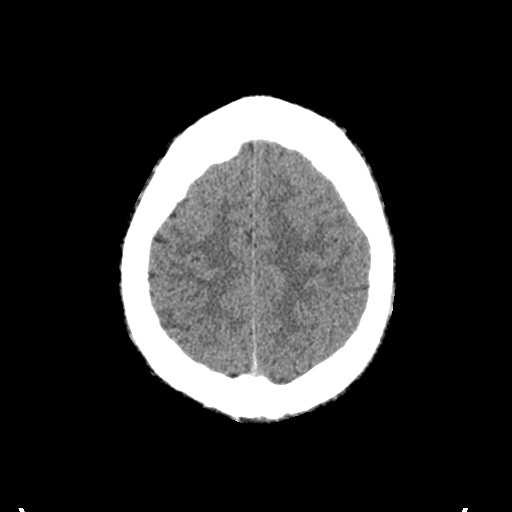
[im 29/32  brain]
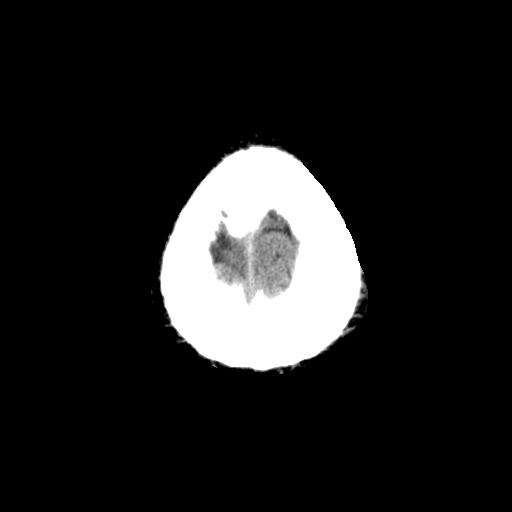

[Series 4: head 3.0 mpr cor · coronal · 0.30mm/px · 3 of 84 slices shown]
[im 28/84  brain]
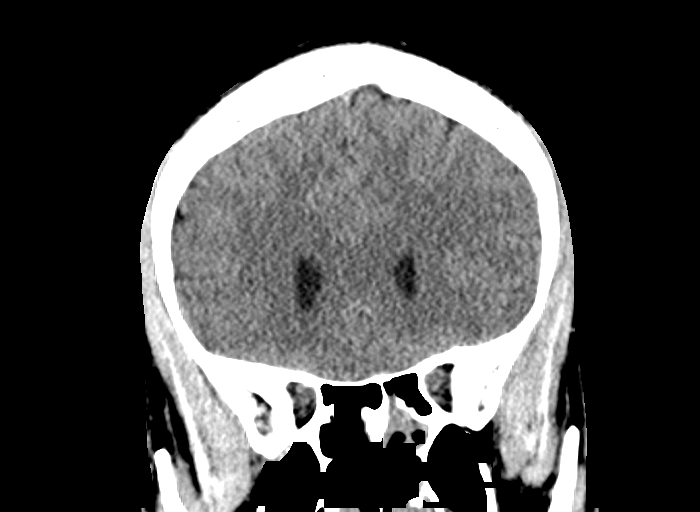
[im 37/84  brain]
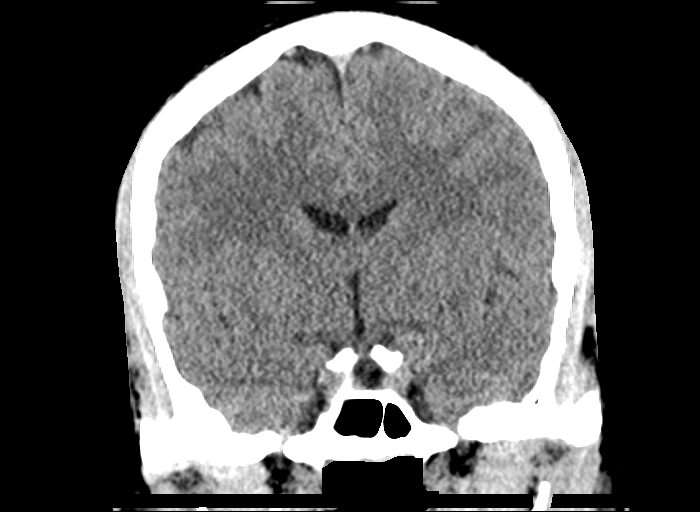
[im 47/84  brain]
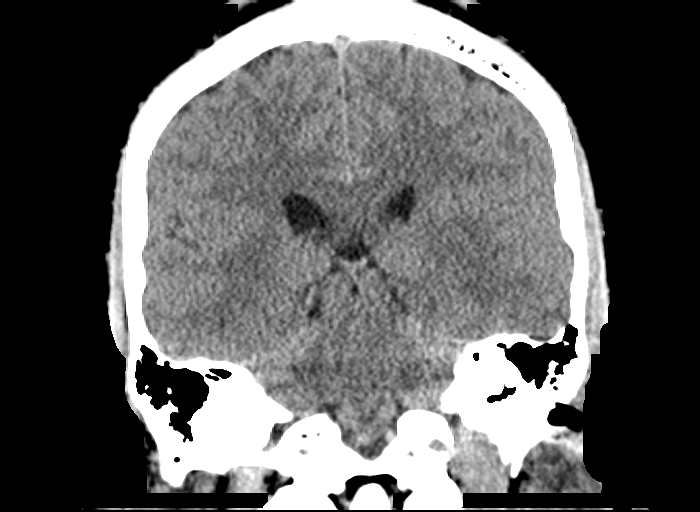

[Series 5: head 3.0 mpr sag · sagittal · 0.30mm/px · 3 of 61 slices shown]
[im 21/61  brain]
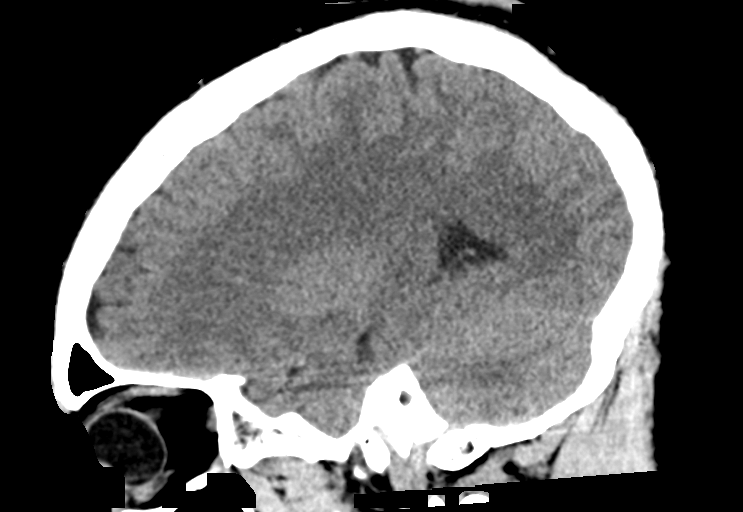
[im 31/61  brain]
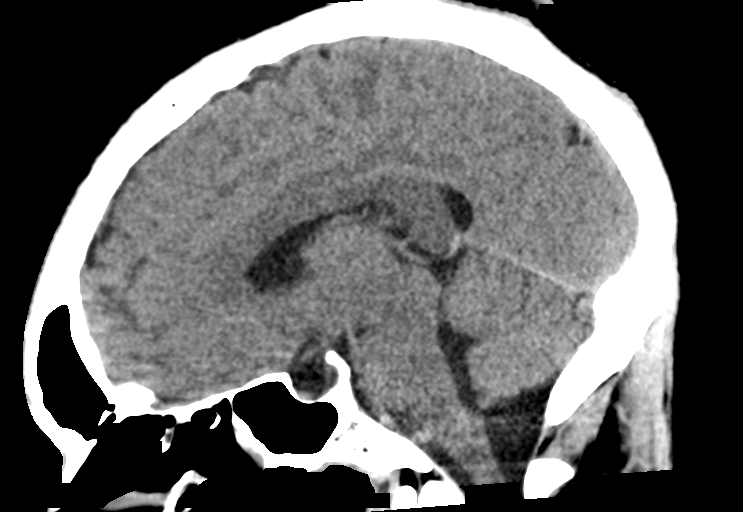
[im 41/61  brain]
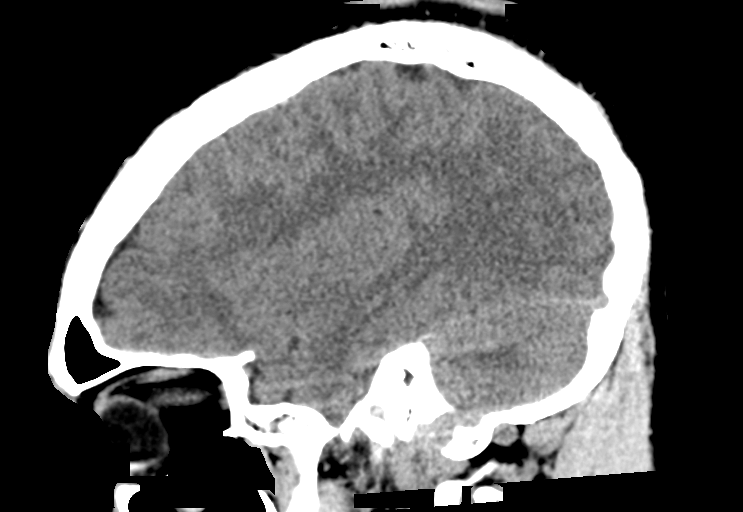

[14 of 47 positions shown; findings below may reference images not displayed]

FINDINGS: Brain: No evidence of acute infarction, hemorrhage, hydrocephalus,
extra-axial collection or mass lesion/mass effect.

Vascular: Negative for hyperdense vessel

Skull: Negative

Sinuses/Orbits: Mucosal edema paranasal sinuses.  Negative orbit

Other: None
IMPRESSION: Normal CT of the brain

Sinus mucosal disease.

## 2020-12-10 ENCOUNTER — Other Ambulatory Visit: Payer: Self-pay | Admitting: Family Medicine

## 2021-03-05 ENCOUNTER — Ambulatory Visit: Payer: Federal, State, Local not specified - PPO | Admitting: Family Medicine

## 2021-03-05 ENCOUNTER — Other Ambulatory Visit: Payer: Self-pay

## 2021-03-05 VITALS — Wt 245.2 lb

## 2021-03-05 DIAGNOSIS — Z113 Encounter for screening for infections with a predominantly sexual mode of transmission: Secondary | ICD-10-CM

## 2021-03-05 DIAGNOSIS — Z299 Encounter for prophylactic measures, unspecified: Secondary | ICD-10-CM

## 2021-03-05 LAB — GRAM STAIN

## 2021-03-05 MED ORDER — DOXYCYCLINE HYCLATE 100 MG PO TABS: 100.0000 mg | ORAL_TABLET | Freq: Two times a day (BID) | ORAL | 0 refills | Status: AC

## 2021-03-05 MED ORDER — CEFTRIAXONE SODIUM 500 MG IJ SOLR
500.0000 mg | Freq: Once | INTRAMUSCULAR | Status: AC
  Administered 2021-03-05: 500 mg via INTRAMUSCULAR

## 2021-03-05 NOTE — Progress Notes (Signed)
Wet mount reviewed and treated for standing orders and per Elveria Rising FNP-BC. Client tolerated injection with minimal complaint. Jossie Ng, RN Client remained in waiting room for 15 minutes following injection and was without complaints. Jossie Ng, RN Ceftriazone was administered in right ventrogluteal (not left as documented in [facility]). Jossie Ng, RN

## 2021-03-05 NOTE — Progress Notes (Signed)
[facility] Department STI clinic/screening visit  Subjective:  Jordan Ferguson is a 25 y.o. male being seen today for an STI screening visit. The patient reports they do have symptoms.    Patient has the following medical conditions:   Patient Active Problem List   Diagnosis Date Noted   Medical certificate issuance 04/22/2020   Left knee injury 03/12/2020   Leukopenia 01/03/2018   Health maintenance examination 01/27/2016   Social anxiety disorder 01/27/2016   Seasonal allergic rhinitis 11/26/2014   Mild intermittent asthma      Chief Complaint  Patient presents with   SEXUALLY TRANSMITTED DISEASE    Screening     HPI  Patient reports here for screening, reports s/sx x 1 week    See flowsheet for further details and programmatic requirements.    The following portions of the patient's history were reviewed and updated as appropriate: allergies, current medications, past medical history, past social history, past surgical history and problem list.  Objective:   Vitals:   03/05/21 1206  Weight: 245 lb 3.2 oz (111.2 kg)    Physical Exam Constitutional:      Appearance: Normal appearance.  HENT:     Head: Normocephalic.     Mouth/Throat:     Mouth: Mucous membranes are moist.     Pharynx: Oropharynx is clear. No oropharyngeal exudate.  Pulmonary:     Effort: Pulmonary effort is normal.  Genitourinary:    Penis: Normal.      Testes: Normal.     Comments: No lice, nits, or pest, no lesions or odor.  Clear discharge, present.  Denies pain or tenderness with paplation of testicles.  No lesions, ulcers or masses present.    Musculoskeletal:     Cervical back: Normal range of motion and neck supple.  Lymphadenopathy:     Cervical: No cervical adenopathy.  Skin:    General: Skin is warm and dry.     Findings: No bruising, erythema, lesion or rash.     Comments: Tattoos present   Neurological:     Mental Status: He is alert and oriented to  person, place, and time.  Psychiatric:        Mood and Affect: Mood normal.        Behavior: Behavior normal.     Assessment and Plan:  [name] is a 25 y.o. male presenting to the [facility] Department for STI screening  1. Screening examination for venereal disease  - Gonococcus culture - Gram stain - Syphilis Serology, [facility] - HIV [facility]  Patient does have STI symptoms Patient accepted all screenings including  gram stain, urethral GC and bloodwork for HIV/RPR.  Patient meets criteria for HepB screening? No. Ordered? No - does not meet criteria  Patient meets criteria for HepC screening? No. Ordered? No - does not meet critieria  Recommended condom use with all sex Discussed importance of condom use for STI prevent  RN: Treat gram stain for + gonorrhea and unknown chlamydia with ceftriaxone 500 mg IM or 1 gm (based on weight) and doxycycline 100 mg PO  BID X 7 days  Assessed pt allergy history, pt had rash after taking amoxicillin, no other symptoms.  Please monitor pt for 15 min after administering ceftriaxone.  Discussed time line for [facility] results and that patient will be called with positive results and encouraged patient to call if he had not heard in 2 weeks Recommended returning for continued  or worsening symptoms.    2. Prophylactic measure - doxycycline (VIBRA-TABS) 100 MG tablet; Take 1 tablet (100 mg total) by mouth 2 (two) times daily for 7 days.  Dispense: 14 tablet; Refill: 0 - cefTRIAXone (ROCEPHIN) injection 500 mg   Return for as needed.  No future appointments.  Wendi Snipes, FNP

## 2021-03-05 NOTE — Progress Notes (Signed)
0

## 2021-04-06 DIAGNOSIS — F4322 Adjustment disorder with anxiety: Secondary | ICD-10-CM | POA: Diagnosis not present

## 2021-04-19 NOTE — Addendum Note (Signed)
Addended by: Wendi Snipes on: 04/19/2021 11:40 AM   Modules accepted: Orders

## 2021-04-20 DIAGNOSIS — F4322 Adjustment disorder with anxiety: Secondary | ICD-10-CM | POA: Diagnosis not present

## 2021-05-17 ENCOUNTER — Other Ambulatory Visit: Payer: Self-pay | Admitting: Family Medicine

## 2021-05-18 NOTE — Telephone Encounter (Signed)
ERx 

## 2021-05-18 NOTE — Telephone Encounter (Signed)
Last Filled 04-22-20 #1 Last OV/CPE 04-22-20 No Future OV [facility]

## 2021-05-18 NOTE — Telephone Encounter (Signed)
Plz phone in due to E prescribing error.  

## 2021-05-20 ENCOUNTER — Other Ambulatory Visit: Payer: Self-pay | Admitting: Family Medicine

## 2021-05-20 NOTE — Telephone Encounter (Signed)
Called the pharmacy and put in order per Dr. Nicanor Alcon orders.

## 2021-05-25 DIAGNOSIS — F4322 Adjustment disorder with anxiety: Secondary | ICD-10-CM | POA: Diagnosis not present

## 2021-08-13 ENCOUNTER — Encounter: Payer: Self-pay | Admitting: Emergency Medicine

## 2021-08-13 ENCOUNTER — Other Ambulatory Visit: Payer: Self-pay

## 2021-08-13 ENCOUNTER — Ambulatory Visit
Admission: EM | Admit: 2021-08-13 | Discharge: 2021-08-13 | Disposition: A | Discharge status: Home / Self Care | Payer: Federal, State, Local not specified - PPO | Attending: Family | Admitting: Family

## 2021-08-13 DIAGNOSIS — M79645 Pain in left finger(s): Secondary | ICD-10-CM | POA: Diagnosis not present

## 2021-08-13 DIAGNOSIS — L03012 Cellulitis of left finger: Secondary | ICD-10-CM

## 2021-08-13 MED ORDER — DOXYCYCLINE HYCLATE 100 MG PO TBEC: 100.0000 mg | DELAYED_RELEASE_TABLET | Freq: Two times a day (BID) | ORAL | 0 refills | Status: AC

## 2021-08-13 NOTE — ED Provider Notes (Signed)
[facility]    CSN: [id number] Arrival date & time: 08/13/21  [id number]      History   Chief Complaint Chief Complaint  Patient presents with   Hand Pain    Left 4th finger    HPI Jordan Ferguson is a 25 y.o. male.   25 year old male presents with left 4th finger redness, swelling and pain around his end of his finger that started about 3 days ago. No known injury but does admit to biting his nails. Has not seen any discharge and has tried taking Ibuprofen, Mobic and applying Tiger Balm with some relief. No previous episode of similar symptoms. Other chronic health issues include environmental allergies and asthma. Currently on Breo Ellipta, Zyrtec and Flonase daily and Albuterol and Xopenex nebulizer as needed.   The history is provided by the patient.   Past Medical History:  Diagnosis Date   Asthma    Reflux    Seizures ([place])    None in many years, no meds for years    Patient Active Problem List   Diagnosis Date Noted   Medical certificate issuance 04/22/2020   Left knee injury 03/12/2020   Leukopenia 01/03/2018   Health maintenance examination 01/27/2016   Social anxiety disorder 01/27/2016   Seasonal allergic rhinitis 11/26/2014   Mild intermittent asthma     Past Surgical History:  Procedure Laterality Date   CONCEALED PENIS [place] EXTRACTION  2017       Home Medications    Prior to Admission medications   Medication Sig Start Date End Date Taking? Authorizing Provider  albuterol (VENTOLIN HFA) 108 (90 Base) MCG/ACT inhaler INHALE 2 PUFFS BY MOUTH EVERY 4 HOURS AS NEEDED FOR WHEEZE OR FOR SHORTNESS OF BREATH 12/11/20  Yes Ria Bush, MD  BREO ELLIPTA 100-25 MCG/INH AEPB INHALE 1 PUFF INTO THE LUNGS DAILY AS NEEDED (DYSPNEA, WHEEZE). 05/18/21  Yes Ria Bush, MD  Cetirizine HCl (ZYRTEC ALLERGY) 10 MG CAPS Take 1 capsule (10 mg total) by mouth daily. 11/26/14  Yes Ria Bush, MD   doxycycline (DORYX) 100 MG EC tablet Take 1 tablet (100 mg total) by mouth 2 (two) times daily for 10 days. 08/13/21 08/23/21 Yes Annayah Worthley, Nicholes Stairs, NP  fluticasone (FLONASE) 50 MCG/ACT nasal spray Place 1 spray into both nostrils daily. 01/03/18  Yes Ria Bush, MD  ibuprofen (ADVIL,MOTRIN) 600 MG tablet Take 600 mg by mouth every 6 (six) hours as needed.    [provider]  levalbuterol (XOPENEX) 1.25 MG/3ML nebulizer solution TAKE 1.25 MG BY NEBULIZATION EVERY 6 (SIX) HOURS AS NEEDED FOR WHEEZING OR SHORTNESS OF BREATH. 12/11/20 07/11/22  Ria Bush, MD  sodium chloride (OCEAN) 0.65 % SOLN nasal spray Place 2 sprays into both nostrils every 2 (two) hours while awake. 09/06/15   Betancourt, Aura Fey, NP  famotidine (PEPCID) 20 MG tablet Take 20 mg by mouth daily as needed for heartburn or indigestion.  09/10/19  [provider]  montelukast (SINGULAIR) 10 MG tablet Take 1 tablet (10 mg total) by mouth at bedtime. 01/27/16 09/10/19  Ria Bush, MD    Family History Family History  Problem Relation Age of Onset   Cancer Maternal Aunt 17       lung (smoker)   Stroke Maternal Uncle 80   CAD Neg Hx    Diabetes Neg Hx     Social History Social History   Tobacco Use   Smoking  status: Some Days    Types: Cigars   Smokeless tobacco: Never   Tobacco comments:    Tried vaping  during high school for 2 months. Last cigar smoked was one month ago.  Vaping Use   Vaping Use: Never used  Substance Use Topics   Alcohol use: Yes    Alcohol/week: 0.0 standard drinks    Comment: social   Drug use: No     Allergies   Amoxicillin and Lamictal [lamotrigine]   Review of Systems Review of Systems  Constitutional:  Negative for activity change, appetite change, chills, fatigue and fever.  Gastrointestinal:  Negative for nausea and vomiting.  Musculoskeletal:  Positive for arthralgias and joint swelling.  Skin:  Positive for color change. Negative for rash.   Allergic/Immunologic: Positive for environmental allergies. Negative for food allergies and immunocompromised state.  Neurological:  Negative for dizziness, tremors, syncope, weakness, light-headedness, numbness and headaches.  Hematological:  Negative for adenopathy. Does not bruise/bleed easily.    Physical Exam Triage Vital Signs ED Triage Vitals  Enc Vitals Group     BP 08/13/21 0847 (!) 152/108     Pulse Rate 08/13/21 0847 83     Resp 08/13/21 0847 16     Temp 08/13/21 0847 98 F (36.7 C)     Temp Source 08/13/21 0847 Oral     SpO2 08/13/21 0847 100 %     Weight 08/13/21 0844 235 lb (106.6 kg)     Height 08/13/21 0844 6\' 4"  (1.93 m)     Head Circumference --      Peak Flow --      Pain Score 08/13/21 0844 6     Pain Loc --      Pain Edu? --      Excl. in GC? --    No data found.  Updated Vital Signs BP (!) 152/108 (BP Location: Left Arm)   Pulse 83   Temp 98 F (36.7 C) (Oral)   Resp 16   Ht 6\' 4"  (1.93 m)   Wt 235 lb (106.6 kg)   SpO2 100%   BMI 28.61 kg/m   Visual Acuity Right Eye Distance:   Left Eye Distance:   Bilateral Distance:    Right Eye Near:   Left Eye Near:    Bilateral Near:     Physical Exam Vitals and nursing note reviewed.  Constitutional:      General: He is awake. He is not in acute distress.    Appearance: He is well-developed and well-groomed.     Comments: Very pleasant gentleman sitting on the exam table in no acute distress but appears nervous and in pain.   HENT:     Head: Normocephalic and atraumatic.     Right Ear: Hearing normal.     Left Ear: Hearing normal.  Eyes:     Extraocular Movements: Extraocular movements intact.     Conjunctiva/sclera: Conjunctivae normal.  Cardiovascular:     Rate and Rhythm: Normal rate.  Pulmonary:     Effort: Pulmonary effort is normal.  Musculoskeletal:        General: Swelling and tenderness present.     Right hand: Normal.     Left hand: Swelling and tenderness present. Normal  range of motion. Normal strength. Normal sensation. There is no disruption of two-point discrimination. Normal capillary refill. Normal pulse.       Hands:     Comments: Redness present from tip of 4th left finger to distal PIP joint. Swelling  present and very tender to palpation. Yellowish fluid seen under skin along medial edge of base of nail. Soft. No discharge able to be expressed. No numbness or neuro deficits noted. Good distal pulses and capillary refill.   Skin:    General: Skin is warm.     Capillary Refill: Capillary refill takes less than 2 seconds.     Findings: Abscess and erythema present. No bruising, ecchymosis, laceration or rash.  Neurological:     General: No focal deficit present.     Mental Status: He is alert and oriented to person, place, and time.     Sensory: Sensation is intact. No sensory deficit.     Motor: Motor function is intact.  Psychiatric:        Attention and Perception: Attention normal.        Mood and Affect: Affect normal. Mood is anxious.        Speech: Speech normal.        Behavior: Behavior normal. Behavior is cooperative.        Thought Content: Thought content normal.        Cognition and Memory: Cognition normal.        Judgment: Judgment normal.     UC Treatments / Results  Labs (all labs ordered are listed, but only abnormal results are displayed) Labs Reviewed - No data to display  EKG   Radiology No results found.  Procedures Incision and Drainage  Date/Time: 08/13/2021 10:24 AM Performed by: Katy Apo, NP Authorized by: Katy Apo, NP   Consent:    Consent obtained:  Verbal   Consent given by:  Patient   Risks, benefits, and alternatives were discussed: yes     Risks discussed:  Bleeding, incomplete drainage, infection and pain   Alternatives discussed:  Alternative treatment and observation Universal protocol:    Procedure explained and questions answered to patient or proxy's satisfaction: yes      Patient identity confirmed:  Verbally with patient and provided demographic data Location:    Type:  Abscess (Paronychial infection at base of nail)   Location:  Upper extremity   Upper extremity location:  Finger   Finger location:  L ring finger Pre-procedure details:    Skin preparation:  Povidone-iodine Sedation:    Sedation type:  None Anesthesia:    Anesthesia method:  None Procedure type:    Complexity:  Simple Procedure details:    Ultrasound guidance: no     Needle aspiration: no     Incision types:  Single straight   Incision depth:  Dermal   Drainage:  Bloody and purulent   Drainage amount:  Moderate   Wound treatment:  Wound left open   Packing materials:  None Post-procedure details:    Procedure completion:  Procedure terminated at patient's request Comments:     Used 18g needle with 2 quick single incisions to express yellowish brown discharge. Tried to massage additional discharge but had to d/c due to patient's pain level and per request. About 1/3 of the discharge expressed from under skin and around nail. Applied Bacitracin ointment and covered with gauze and rolled gauze.  (including critical care time)  Medications Ordered in UC Medications - No data to display  Initial Impression / Assessment and Plan / UC Course  I have reviewed the triage vital signs and the nursing notes.  Pertinent labs & imaging results that were available during my care of the patient were reviewed by me and considered in  my medical decision making (see chart for details).     Discussed with patient that he has an infection at the base of his nail that has spread to the entire end of his finger (cellulitis). Was able to express some discharge but still a fair amount of discharge under skin. Recommend soak left hand/finger in warm to hot water for 15 to 20 minutes every 4 to 6 hours for the next few days to help facilitate drainage. Since allergic to Amoxicillin, start Doxycycline  100mg  twice a day as directed. Keep area covered. Discussed possible additional I & D and further management if not resolving. Follow-up in 3 to 4 days if not improving.    Final Clinical Impressions(s) / UC Diagnoses   Final diagnoses:  Paronychia of left ring finger  Finger pain, left  Cellulitis of left ring finger     Discharge Instructions      Recommend soak left hand/finger in warm to hot water for 15 to 20 minutes every 4 to 6 hours for the next few days. Start Doxycycline 100mg  twice a day as directed to help with infection. Keep area covered. Follow-up in 3 to 4 days if not improving.     ED Prescriptions     Medication Sig Dispense Auth. Provider   doxycycline (DORYX) 100 MG EC tablet Take 1 tablet (100 mg total) by mouth 2 (two) times daily for 10 days. 20 tablet Dorinne Graeff, Nicholes Stairs, NP      PDMP not reviewed this encounter.   Katy Apo, NP 08/14/21 1251

## 2021-08-13 NOTE — ED Triage Notes (Signed)
Patient c/o pain, swelling and redness in his left 4th finger that started 3 days ago.  Patient denies injury.

## 2021-08-13 NOTE — Discharge Instructions (Addendum)
Recommend soak left hand/finger in warm to hot water for 15 to 20 minutes every 4 to 6 hours for the next few days. Start Doxycycline 100mg  twice a day as directed to help with infection. Keep area covered. Follow-up in 3 to 4 days if not improving.

## 2021-09-29 ENCOUNTER — Other Ambulatory Visit: Payer: Self-pay | Admitting: Family Medicine

## 2021-09-30 ENCOUNTER — Telehealth: Payer: Self-pay | Admitting: Family Medicine

## 2021-09-30 NOTE — Telephone Encounter (Signed)
°  Encourage patient to contact the pharmacy for refills or they can request refills through [facility]  LAST APPOINTMENT DATE:  Please schedule appointment if longer than 1 year  NEXT APPOINTMENT DATE:10/13/21  MEDICATION:albuterol (VENTOLIN HFA) 108 (90 Base) MCG/ACT inhaler  Is the patient out of medication?   PHARMACY:[id number] [place], [place] - [address]  Let patient know to contact pharmacy at the end of the day to make sure medication is ready.  Please notify patient to allow 48-72 hours to process  CLINICAL FILLS OUT ALL BELOW:   LAST REFILL:  QTY:  REFILL DATE:    OTHER COMMENTS:    Okay for refill?  Please advise

## 2021-10-04 ENCOUNTER — Telehealth: Payer: Self-pay | Admitting: Family Medicine

## 2021-10-04 MED ORDER — ALBUTEROL SULFATE HFA 108 (90 BASE) MCG/ACT IN AERS: INHALATION_SPRAY | RESPIRATORY_TRACT | 3 refills | Status: DC

## 2021-10-04 NOTE — Addendum Note (Signed)
Addended by: Consuella Lose on: 10/04/2021 09:05 AM   Modules accepted: Orders

## 2021-10-04 NOTE — Telephone Encounter (Signed)
RX refilled  

## 2021-10-13 ENCOUNTER — Encounter: Payer: Federal, State, Local not specified - PPO | Admitting: Family Medicine

## 2021-12-01 ENCOUNTER — Other Ambulatory Visit: Payer: Self-pay

## 2021-12-01 ENCOUNTER — Ambulatory Visit (HOSPITAL_COMMUNITY)
Admission: EM | Admit: 2021-12-01 | Discharge: 2021-12-01 | Disposition: A | Discharge status: Home / Self Care | Payer: Federal, State, Local not specified - PPO

## 2021-12-01 NOTE — ED Notes (Signed)
Wart to left elbow.  Patient requesting site be removed.  Spoke to dr hagler.  Recommended going to dermatologist.  Recommended consulting insurance carrier, speak to pcp, or calling a dermatologist for treatment.  Explained we do not have resources here to remove wart.   ?

## 2021-12-22 DIAGNOSIS — B078 Other viral warts: Secondary | ICD-10-CM | POA: Diagnosis not present

## 2022-01-05 ENCOUNTER — Other Ambulatory Visit: Payer: Self-pay | Admitting: Family Medicine

## 2022-01-19 ENCOUNTER — Encounter: Payer: Self-pay | Admitting: Family Medicine

## 2022-01-19 ENCOUNTER — Ambulatory Visit (INDEPENDENT_AMBULATORY_CARE_PROVIDER_SITE_OTHER): Payer: Federal, State, Local not specified - PPO | Admitting: Family Medicine

## 2022-01-19 VITALS — BP 122/84 | HR 75 | Temp 97.9°F | Ht 76.0 in | Wt 252.4 lb

## 2022-01-19 DIAGNOSIS — Z131 Encounter for screening for diabetes mellitus: Secondary | ICD-10-CM

## 2022-01-19 DIAGNOSIS — J4520 Mild intermittent asthma, uncomplicated: Secondary | ICD-10-CM

## 2022-01-19 DIAGNOSIS — D72819 Decreased white blood cell count, unspecified: Secondary | ICD-10-CM

## 2022-01-19 DIAGNOSIS — Z113 Encounter for screening for infections with a predominantly sexual mode of transmission: Secondary | ICD-10-CM

## 2022-01-19 DIAGNOSIS — F439 Reaction to severe stress, unspecified: Secondary | ICD-10-CM

## 2022-01-19 DIAGNOSIS — Z13220 Encounter for screening for lipoid disorders: Secondary | ICD-10-CM

## 2022-01-19 DIAGNOSIS — Z0000 Encounter for general adult medical examination without abnormal findings: Secondary | ICD-10-CM | POA: Diagnosis not present

## 2022-01-19 DIAGNOSIS — R21 Rash and other nonspecific skin eruption: Secondary | ICD-10-CM | POA: Insufficient documentation

## 2022-01-19 DIAGNOSIS — J301 Allergic rhinitis due to pollen: Secondary | ICD-10-CM

## 2022-01-19 LAB — CBC WITH DIFFERENTIAL/PLATELET
Basophils Absolute: 0 10*3/uL (ref 0.0–0.1)
Basophils Relative: 0.7 % (ref 0.0–3.0)
Eosinophils Absolute: 0.4 10*3/uL (ref 0.0–0.7)
Eosinophils Relative: 10.3 % — ABNORMAL HIGH (ref 0.0–5.0)
HCT: 47.1 % (ref 39.0–52.0)
Hemoglobin: 15.4 g/dL (ref 13.0–17.0)
Lymphocytes Relative: 41.4 % (ref 12.0–46.0)
Lymphs Abs: 1.4 10*3/uL (ref 0.7–4.0)
MCHC: 32.6 g/dL (ref 30.0–36.0)
MCV: 83 fl (ref 78.0–100.0)
Monocytes Absolute: 0.2 10*3/uL (ref 0.1–1.0)
Monocytes Relative: 6.7 % (ref 3.0–12.0)
Neutro Abs: 1.4 10*3/uL (ref 1.4–7.7)
Neutrophils Relative %: 40.9 % — ABNORMAL LOW (ref 43.0–77.0)
Platelets: 222 10*3/uL (ref 150.0–400.0)
RBC: 5.68 Mil/uL (ref 4.22–5.81)
RDW: 13.5 % (ref 11.5–15.5)
WBC: 3.5 10*3/uL — ABNORMAL LOW (ref 4.0–10.5)

## 2022-01-19 LAB — BASIC METABOLIC PANEL
BUN: 16 mg/dL (ref 6–23)
CO2: 27 mEq/L (ref 19–32)
Calcium: 9.6 mg/dL (ref 8.4–10.5)
Chloride: 102 mEq/L (ref 96–112)
Creatinine, Ser: 0.99 mg/dL (ref 0.40–1.50)
GFR: 105.52 mL/min (ref >60.00)
Glucose, Bld: 87 mg/dL (ref 70–99)
Potassium: 4.2 mEq/L (ref 3.5–5.1)
Sodium: 136 mEq/L (ref 135–145)

## 2022-01-19 LAB — LIPID PANEL
Cholesterol: 147 mg/dL (ref 0–200)
HDL: 63.8 mg/dL (ref >39.00)
LDL Cholesterol: 73 mg/dL (ref 0–99)
NonHDL: 82.83
Total CHOL/HDL Ratio: 2
Triglycerides: 50 mg/dL (ref 0.0–149.0)
VLDL: 10 mg/dL (ref 0.0–40.0)

## 2022-01-19 LAB — TSH: TSH: 1.88 u[IU]/mL (ref 0.35–5.50)

## 2022-01-19 NOTE — Progress Notes (Signed)
? ? Patient ID: Jordan Ferguson, male    DOB: Aug 05, 1996, 26 y.o.   MRN: [id number] ? ?This visit was conducted in person. ? ?BP 122/84   Pulse 75   Temp 97.9 ?F (36.6 ?C) (Temporal)   Ht 6\' 4"  (1.93 m)   Wt 252 lb 6 oz (114.5 kg)   SpO2 97%   BMI 30.72 kg/m?   ? ?CC: CPE ?Subjective:  ? ?HPI: ?Jordan Ferguson is a 26 y.o. male presenting on 01/19/2022 for Annual Exam ? ? ?Last seen 03/2020.  ?Fasting today.  ? ?Currently sexually active - 2-3 partners in the past year. Within the past year tested positive for gonorrhea and treated, no retesting done since. Not using protection. No urethral discharge, abd pain, dysuria. Notes itchy rash to groin - ?ingrown hairs. ?  ?Asthma - only using PRN albuterol - twice a week. Triggers are cat and dog hair as well as pollen. Not using breo ellipta. Also albuterol inh or xopenex neb PRN - last used last night. Also takes zyrtec PRN and flonase PRN for allergic rhinitis flare (worse in fall).  ? ?Enjoys drums.  ?Upcoming appt with [employer] scout.  ? ?Stressed with work. [employer]  ? ?Preventative: ?Flu shot yearly ?Tdap 2008, declines ?COVID vaccine - did not complete ?Seat belt use discussed. ?Sunscreen use discussed. No changing moles on skin.  ?Non smoker  ?Alcohol - a few beers/month  ?Rec drugs - none  ?Currently sexually active - requests STD screen  ?Dentist - due ?Eye exam - last seen 2021  ? ?Lives with mother and brother, 1 dog and rabbit ?Edu: finished at 2022 ?Occ: [employer]  ?Activity: lifts weights at gym, cardio - working out more ?Diet: good water, fruits/vegetables some ?   ? ?Relevant past medical, surgical, family and social history reviewed and updated as indicated. Interim medical history since our last visit reviewed. ?Allergies and medications reviewed and updated. ?Outpatient Medications Prior to Visit  ?Medication Sig Dispense Refill  ? albuterol (VENTOLIN HFA) 108 (90 Base) MCG/ACT inhaler INHALE 2 PUFFS BY MOUTH EVERY 4 HOURS  AS NEEDED FOR WHEEZE OR FOR SHORTNESS OF BREATH 18 each 0  ? BREO ELLIPTA 100-25 MCG/INH AEPB INHALE 1 PUFF INTO THE LUNGS DAILY AS NEEDED (DYSPNEA, WHEEZE). 60 each 3  ? Cetirizine HCl (ZYRTEC ALLERGY) 10 MG CAPS Take 1 capsule (10 mg total) by mouth daily.    ? fluticasone (FLONASE) 50 MCG/ACT nasal spray Place 1 spray into both nostrils daily. 16 g 3  ? ibuprofen (ADVIL,MOTRIN) 600 MG tablet Take 600 mg by mouth every 6 (six) hours as needed.    ? levalbuterol (XOPENEX) 1.25 MG/3ML nebulizer solution TAKE 1.25 MG BY NEBULIZATION EVERY 6 (SIX) HOURS AS NEEDED FOR WHEEZING OR SHORTNESS OF BREATH. 72 mL 1  ? sodium chloride (OCEAN) 0.65 % SOLN nasal spray Place 2 sprays into both nostrils every 2 (two) hours while awake.  0  ? ?No facility-administered medications prior to visit.  ?  ? ?Per HPI unless specifically indicated in ROS section below ?Review of Systems  ?Constitutional:  Negative for activity change, appetite change, chills, fatigue, fever and unexpected weight change.  ?HENT:  Negative for hearing loss.   ?Eyes:  Negative for visual disturbance.  ?Respiratory:  Positive for shortness of breath and wheezing. Negative for cough and chest tightness.   ?     Asthma related  ?Cardiovascular:  Negative for chest pain, palpitations and leg swelling.  ?Gastrointestinal:  Negative for abdominal distention, abdominal pain, blood in stool, constipation, diarrhea, nausea and vomiting.  ?Genitourinary:  Negative for difficulty urinating and hematuria.  ?Musculoskeletal:  Negative for arthralgias, myalgias and neck pain.  ?Skin:  Negative for rash.  ?Neurological:  Negative for dizziness, seizures, syncope and headaches.  ?Hematological:  Negative for adenopathy. Does not bruise/bleed easily.  ?Psychiatric/Behavioral:  Negative for dysphoric mood. The patient is not nervous/anxious.   ? ?Objective:  ?BP 122/84   Pulse 75   Temp 97.9 ?F (36.6 ?C) (Temporal)   Ht 6\' 4"  (1.93 m)   Wt 252 lb 6 oz (114.5 kg)   SpO2  97%   BMI 30.72 kg/m?   ?Wt Readings from Last 3 Encounters:  ?01/19/22 252 lb 6 oz (114.5 kg)  ?08/13/21 235 lb (106.6 kg)  ?03/05/21 245 lb 3.2 oz (111.2 kg)  ?  ?  ?Physical Exam ?Vitals and nursing note reviewed.  ?Constitutional:   ?   General: He is not in acute distress. ?   Appearance: Normal appearance. He is well-developed. He is not ill-appearing.  ?HENT:  ?   Head: Normocephalic and atraumatic.  ?   Right Ear: Hearing, tympanic membrane, ear canal and external ear normal.  ?   Left Ear: Hearing, tympanic membrane, ear canal and external ear normal.  ?Eyes:  ?   General: No scleral icterus. ?   Extraocular Movements: Extraocular movements intact.  ?   Conjunctiva/sclera: Conjunctivae normal.  ?   Pupils: Pupils are equal, round, and reactive to light.  ?Neck:  ?   Thyroid: No thyroid mass or thyromegaly.  ?Cardiovascular:  ?   Rate and Rhythm: Normal rate and regular rhythm.  ?   Pulses: Normal pulses.     ?     Radial pulses are 2+ on the right side and 2+ on the left side.  ?   Heart sounds: Normal heart sounds. No murmur heard. ?Pulmonary:  ?   Effort: Pulmonary effort is normal. No respiratory distress.  ?   Breath sounds: Normal breath sounds. No wheezing, rhonchi or rales.  ?Abdominal:  ?   General: Bowel sounds are normal. There is no distension.  ?   Palpations: Abdomen is soft. There is no mass.  ?   Tenderness: There is no abdominal tenderness. There is no guarding or rebound.  ?   Hernia: No hernia is present.  ?Musculoskeletal:     ?   General: Normal range of motion.  ?   Cervical back: Normal range of motion and neck supple.  ?   Right lower leg: No edema.  ?   Left lower leg: No edema.  ?Lymphadenopathy:  ?   Cervical: No cervical adenopathy.  ?Skin: ?   General: Skin is warm and dry.  ?   Findings: Rash present. Rash is papular.  ?   Comments: Fine papular rash to groin without pustules or vesicles or erythema  ?Neurological:  ?   General: No focal deficit present.  ?   Mental Status: He  is alert and oriented to person, place, and time.  ?Psychiatric:     ?   Mood and Affect: Mood normal.     ?   Behavior: Behavior normal.     ?   Thought Content: Thought content normal.     ?   Judgment: Judgment normal.  ? ?   ? ?  01/19/2022  ? 11:47 AM 04/22/2020  ?  3:27 PM 01/03/2018  ? 12:07 PM  01/27/2016  ? 10:43 AM  ?Depression screen PHQ 2/9  ?Decreased Interest 1 0 0 1  ?Down, Depressed, Hopeless 1 0 0 1  ?PHQ - 2 Score 2 0 0 2  ?Altered sleeping 1   2  ?Tired, decreased energy 2   1  ?Change in appetite 2   0  ?Feeling bad or failure about yourself  1   2  ?Trouble concentrating 2   2  ?Moving slowly or fidgety/restless 2   2  ?Suicidal thoughts 0   0  ?PHQ-9 Score 12   11  ?Difficult doing work/chores Somewhat difficult   Somewhat difficult  ? ? ?  01/19/2022  ? 11:47 AM 01/27/2016  ? 10:44 AM  ?GAD 7 : Generalized Anxiety Score  ?Nervous, Anxious, on Edge 2 2  ?Control/stop worrying 1 2  ?Worry too much - different things 2 2  ?Trouble relaxing 1 2  ?Restless 2 1  ?Easily annoyed or irritable 3 2  ?Afraid - awful might happen 3 2  ?Total GAD 7 Score 14 13  ?Anxiety Difficulty Somewhat difficult Somewhat difficult  ? ?Assessment & Plan:  ? ?Problem List Items Addressed This Visit   ? ? Health maintenance examination - Primary (Chronic)  ?  Preventative protocols reviewed and updated unless pt declined. ?Discussed healthy diet and lifestyle.  ? ?  ?  ? Mild intermittent asthma  ?  Stable period. Encouraged more regular breo use.  ? ?  ?  ? Seasonal allergic rhinitis  ?  Continue flonase, zyrtec.  ? ?  ?  ? Leukopenia  ?  Update CBC. Anticipate chronic, benign.  ? ?  ?  ? Relevant Orders  ? CBC with Differential/Platelet  ? TSH  ? Skin rash  ?  Skin rash to groin - unclear cause, but not consistent with herpes. Will monitor for now, await STD screen.  ? ?  ?  ? Stress  ?  Discussed work stressors. He doesn't think he could afford counseling.  ? ?  ?  ? ?Other Visit Diagnoses   ? ? Screen for STD (sexually  transmitted disease)      ? Relevant Orders  ? HIV Antibody (routine testing w rflx)  ? RPR  ? C. trachomatis/N. gonorrhoeae RNA  ? Lipid screening      ? Relevant Orders  ? Lipid panel  ? Diabetes mellitus screening

## 2022-01-19 NOTE — Assessment & Plan Note (Signed)
Skin rash to groin - unclear cause, but not consistent with herpes. Will monitor for now, await STD screen.  ?

## 2022-01-19 NOTE — Assessment & Plan Note (Addendum)
Update CBC. Anticipate chronic, benign.  ?

## 2022-01-19 NOTE — Assessment & Plan Note (Signed)
Discussed work stressors. He doesn't think he could afford counseling.  ?

## 2022-01-19 NOTE — Assessment & Plan Note (Addendum)
Stable period. Encouraged more regular breo use.  ?

## 2022-01-19 NOTE — Patient Instructions (Addendum)
Labs today  ?Return for urine test.  ?Return as needed or in 1 year for next physical.  ? ?Health Maintenance, Male ?Adopting a healthy lifestyle and getting preventive care are important in promoting health and wellness. Ask your health care provider about: ?The right schedule for you to have regular tests and exams. ?Things you can do on your own to prevent diseases and keep yourself healthy. ?What should I know about diet, weight, and exercise? ?Eat a healthy diet ? ?Eat a diet that includes plenty of vegetables, fruits, low-fat dairy products, and lean protein. ?Do not eat a lot of foods that are high in solid fats, added sugars, or sodium. ?Maintain a healthy weight ?Body mass index (BMI) is a measurement that can be used to identify possible weight problems. It estimates body fat based on height and weight. Your health care provider can help determine your BMI and help you achieve or maintain a healthy weight. ?Get regular exercise ?Get regular exercise. This is one of the most important things you can do for your health. Most adults should: ?Exercise for at least 150 minutes each week. The exercise should increase your heart rate and make you sweat (moderate-intensity exercise). ?Do strengthening exercises at least twice a week. This is in addition to the moderate-intensity exercise. ?Spend less time sitting. Even light physical activity can be beneficial. ?Watch cholesterol and blood lipids ?Have your blood tested for lipids and cholesterol at 26 years of age, then have this test every 5 years. ?You may need to have your cholesterol levels checked more often if: ?Your lipid or cholesterol levels are high. ?You are older than 26 years of age. ?You are at high risk for heart disease. ?What should I know about cancer screening? ?Many types of cancers can be detected early and may often be prevented. Depending on your health history and family history, you may need to have cancer screening at various ages. This  may include screening for: ?Colorectal cancer. ?Prostate cancer. ?Skin cancer. ?Lung cancer. ?What should I know about heart disease, diabetes, and high blood pressure? ?Blood pressure and heart disease ?High blood pressure causes heart disease and increases the risk of stroke. This is more likely to develop in people who have high blood pressure readings or are overweight. ?Talk with your health care provider about your target blood pressure readings. ?Have your blood pressure checked: ?Every 3-5 years if you are 56-74 years of age. ?Every year if you are 68 years old or older. ?If you are between the ages of 35 and 23 and are a current or former smoker, ask your health care provider if you should have a one-time screening for abdominal aortic aneurysm (AAA). ?Diabetes ?Have regular diabetes screenings. This checks your fasting blood sugar level. Have the screening done: ?Once every three years after age 74 if you are at a normal weight and have a low risk for diabetes. ?More often and at a younger age if you are overweight or have a high risk for diabetes. ?What should I know about preventing infection? ?Hepatitis B ?If you have a higher risk for hepatitis B, you should be screened for this virus. Talk with your health care provider to find out if you are at risk for hepatitis B infection. ?Hepatitis C ?Blood testing is recommended for: ?Everyone born from [birth year] through 1965. ?Anyone with known risk factors for hepatitis C. ?Sexually transmitted infections (STIs) ?You should be screened each year for STIs, including gonorrhea and chlamydia, if: ?You are  sexually active and are younger than 26 years of age. ?You are older than 26 years of age and your health care provider tells you that you are at risk for this type of infection. ?Your sexual activity has changed since you were last screened, and you are at increased risk for chlamydia or gonorrhea. Ask your health care provider if you are at risk. ?Ask your health  care provider about whether you are at high risk for HIV. Your health care provider may recommend a prescription medicine to help prevent HIV infection. If you choose to take medicine to prevent HIV, you should first get tested for HIV. You should then be tested every 3 months for as long as you are taking the medicine. ?Follow these instructions at home: ?Alcohol use ?Do not drink alcohol if your health care provider tells you not to drink. ?If you drink alcohol: ?Limit how much you have to 0-2 drinks a day. ?Know how much alcohol is in your drink. In the [place] one drink equals one 12 oz bottle of beer (355 mL), one 5 oz glass of wine (148 mL), or one 1? oz glass of hard liquor (44 mL). ?Lifestyle ?Do not use any products that contain nicotine or tobacco. These products include cigarettes, chewing tobacco, and vaping devices, such as e-cigarettes. If you need help quitting, ask your health care provider. ?Do not use street drugs. ?Do not share needles. ?Ask your health care provider for help if you need support or information about quitting drugs. ?General instructions ?Schedule regular health, dental, and eye exams. ?Stay current with your vaccines. ?Tell your health care provider if: ?You often feel depressed. ?You have ever been abused or do not feel safe at home. ?Summary ?Adopting a healthy lifestyle and getting preventive care are important in promoting health and wellness. ?Follow your health care provider's instructions about healthy diet, exercising, and getting tested or screened for diseases. ?Follow your health care provider's instructions on monitoring your cholesterol and blood pressure. ?This information is not intended to replace advice given to you by your health care provider. Make sure you discuss any questions you have with your health care provider. ?Document Revised: 02/01/2021 Document Reviewed: 02/01/2021 ?Elsevier Patient Education ? [employer]. ? ?

## 2022-01-19 NOTE — Assessment & Plan Note (Signed)
Continue flonase, zyrtec 

## 2022-01-19 NOTE — Assessment & Plan Note (Signed)
Preventative protocols reviewed and updated unless pt declined. Discussed healthy diet and lifestyle.  

## 2022-01-20 LAB — C. TRACHOMATIS/N. GONORRHOEAE RNA
C. trachomatis RNA, TMA: NOT DETECTED
N. gonorrhoeae RNA, TMA: NOT DETECTED

## 2022-01-20 LAB — HIV ANTIBODY (ROUTINE TESTING W REFLEX): HIV 1&2 Ab, 4th Generation: NONREACTIVE

## 2022-01-20 LAB — RPR: RPR Ser Ql: NONREACTIVE

## 2022-01-24 ENCOUNTER — Other Ambulatory Visit: Payer: Self-pay | Admitting: Family Medicine

## 2022-01-26 ENCOUNTER — Other Ambulatory Visit: Payer: Self-pay | Admitting: Family Medicine

## 2022-01-27 ENCOUNTER — Telehealth: Payer: Self-pay | Admitting: Family Medicine

## 2022-01-27 MED ORDER — LEVALBUTEROL HCL 1.25 MG/3ML IN NEBU
1.2500 mg | INHALATION_SOLUTION | Freq: Four times a day (QID) | RESPIRATORY_TRACT | 1 refills | Status: AC | PRN
Start: 2022-01-27 — End: 2023-08-27

## 2022-01-27 NOTE — Telephone Encounter (Signed)
Pt's mom Jordan Ferguson) called and stated "the inhaler was declined by Dr. Sharen Hones", mom stated "he was seen for a cpe on 01/19/2022." Would like to speak with the nurse on why pt was declined and if he could get a refill. Mom requested that his results be sent in the mail to this address:  [address] [place] [id number].  ? ?Jordan Ferguson (Mother): [phone] ?Pt's #: [phone] ?

## 2022-01-27 NOTE — Telephone Encounter (Signed)
Spoke with pt's mom, Dewaine Oats (on dpr) notifying her I sent in rx for albuterol inahler and mailed lab results to pt.  Also, informed her Dr. Darnell Level sent results to pt's MyChart.  She verbalizes understanding, expresses her thanks and will notify pt about MyChart msg.  ?

## 2022-02-18 ENCOUNTER — Other Ambulatory Visit: Payer: Self-pay | Admitting: Orthopedic Surgery

## 2022-02-18 DIAGNOSIS — S83512A Sprain of anterior cruciate ligament of left knee, initial encounter: Secondary | ICD-10-CM

## 2022-02-20 ENCOUNTER — Ambulatory Visit
Admission: RE | Admit: 2022-02-20 | Discharge: 2022-02-20 | Disposition: A | Discharge status: Home / Self Care | Payer: Federal, State, Local not specified - PPO | Source: Ambulatory Visit | Attending: Orthopedic Surgery | Admitting: Orthopedic Surgery

## 2022-02-20 DIAGNOSIS — S83512A Sprain of anterior cruciate ligament of left knee, initial encounter: Secondary | ICD-10-CM | POA: Insufficient documentation

## 2022-04-08 NOTE — Telephone Encounter (Signed)
error 

## 2022-11-08 ENCOUNTER — Ambulatory Visit
Admission: EM | Admit: 2022-11-08 | Discharge: 2022-11-08 | Disposition: A | Discharge status: Home / Self Care | Payer: Self-pay | Attending: Emergency Medicine | Admitting: Emergency Medicine

## 2022-11-08 ENCOUNTER — Encounter: Payer: Self-pay | Admitting: Emergency Medicine

## 2022-11-08 ENCOUNTER — Ambulatory Visit (INDEPENDENT_AMBULATORY_CARE_PROVIDER_SITE_OTHER): Payer: Self-pay

## 2022-11-08 ENCOUNTER — Other Ambulatory Visit: Payer: Self-pay

## 2022-11-08 DIAGNOSIS — M25561 Pain in right knee: Secondary | ICD-10-CM

## 2022-11-08 DIAGNOSIS — Z1152 Encounter for screening for COVID-19: Secondary | ICD-10-CM | POA: Insufficient documentation

## 2022-11-08 DIAGNOSIS — B353 Tinea pedis: Secondary | ICD-10-CM | POA: Insufficient documentation

## 2022-11-08 DIAGNOSIS — H6502 Acute serous otitis media, left ear: Secondary | ICD-10-CM | POA: Insufficient documentation

## 2022-11-08 LAB — RAPID INFLUENZA A&B ANTIGENS
Influenza A (ARMC): NEGATIVE
Influenza B (ARMC): NEGATIVE

## 2022-11-08 LAB — SARS CORONAVIRUS 2 BY RT PCR: SARS Coronavirus 2 by RT PCR: NEGATIVE

## 2022-11-08 MED ORDER — CEFDINIR 300 MG PO CAPS: 300.0000 mg | ORAL_CAPSULE | Freq: Two times a day (BID) | ORAL | 0 refills | Status: AC

## 2022-11-08 MED ORDER — PREDNISONE 20 MG PO TABS: 40.0000 mg | ORAL_TABLET | Freq: Every day | ORAL | 0 refills | Status: AC

## 2022-11-08 MED ORDER — IBUPROFEN 800 MG PO TABS: 800.0000 mg | ORAL_TABLET | Freq: Three times a day (TID) | ORAL | 0 refills | Status: AC

## 2022-11-08 MED ORDER — CLOTRIMAZOLE 1 % EX CREA: 1.0000 | TOPICAL_CREAM | Freq: Two times a day (BID) | CUTANEOUS | 0 refills | Status: AC

## 2022-11-08 NOTE — ED Provider Notes (Signed)
[facility]    CSN: [id number] Arrival date & time: 11/08/22  1339      History   Chief Complaint Chief Complaint  Patient presents with   Leg Pain    HPI Jordan Ferguson is a 27 y.o. male.   Patient presents for evaluation of anterior right knee pain beginning 1 day ago after injury.  Endorses that he was wrestling, he jumped into the air landing on his feet and twisted leg falling directly onto the right knee.  Has had pain with bearing weight since.  Pain pain is present when completing range of motion.  Has experienced tingling to the right fifth toe.  Has attempted use of Tylenol.  History of left knee meniscal tear.   Patient concerned with bilateral pruritus to the toes with rash believes he has.  Has been present for at least 4 weeks.  Has seen intermittent drainage.  Has not attempted treatment.  Denies fevers.  Patient presents for evaluation of congestion and a nonproductive cough present for 1 week.  Has felt feverish intermittently, subjective.  Began to experience a constant left-sided frontal headache beginning this morning with associated photophobia and phonophobia.  Has been tolerating food and liquids but has had decreased appetite.  No known sick contacts.  Experiencing wheezing at base time due to history of asthma, has not worsened.  Denies shortness of breath.       Past Medical History:  Diagnosis Date   Asthma    Reflux    Seizures ([place])    None in many years, no meds for years    Patient Active Problem List   Diagnosis Date Noted   Skin rash 01/19/2022   Stress [id number]   Medical certificate issuance 04/22/2020   Left knee injury 03/12/2020   Leukopenia 01/03/2018   Health maintenance examination 01/27/2016   Social anxiety disorder 01/27/2016   Seasonal allergic rhinitis 11/26/2014   Mild intermittent asthma     Past Surgical History:  Procedure Laterality Date   CONCEALED [place] EXTRACTION  2017       Home Medications    Prior to Admission medications   Medication Sig Start Date End Date Taking? Authorizing Provider  albuterol (VENTOLIN HFA) 108 (90 Base) MCG/ACT inhaler INHALE 2 PUFFS BY MOUTH EVERY 4 HOURS AS NEEDED FOR WHEEZE OR FOR SHORTNESS OF BREATH 01/27/22   Ria Bush, MD  BREO ELLIPTA 100-25 MCG/INH AEPB INHALE 1 PUFF INTO THE LUNGS DAILY AS NEEDED (DYSPNEA, WHEEZE). 05/18/21   Ria Bush, MD  Cetirizine HCl (ZYRTEC ALLERGY) 10 MG CAPS Take 1 capsule (10 mg total) by mouth daily. 11/26/14   Ria Bush, MD  fluticasone [facility]) 50 MCG/ACT nasal spray Place 1 spray into both nostrils daily. 01/03/18   Ria Bush, MD  ibuprofen (ADVIL,MOTRIN) 600 MG tablet Take 600 mg by mouth every 6 (six) hours as needed.    [provider]  levalbuterol Penne Lash) 1.25 MG/3ML nebulizer solution Take 1.25 mg by nebulization every 6 (six) hours as needed for wheezing or shortness of breath. 01/27/22 08/27/23  Ria Bush, MD  sodium chloride (OCEAN) 0.65 % SOLN nasal spray Place 2 sprays into both nostrils every 2 (two) hours while awake. 09/06/15   Betancourt, Aura Fey, NP  famotidine (PEPCID) 20 MG tablet Take 20 mg by mouth daily as needed for heartburn or indigestion.  09/10/19  [provider]  montelukast (SINGULAIR) 10 MG  tablet Take 1 tablet (10 mg total) by mouth at bedtime. 01/27/16 09/10/19  Ria Bush, MD    Family History Family History  Problem Relation Age of Onset   Cancer Maternal Aunt 66       lung (smoker)   Stroke Maternal Uncle 80   CAD Neg Hx    Diabetes Neg Hx     Social History Social History   Tobacco Use   Smoking status: Some Days    Types: Cigars   Smokeless tobacco: Never   Tobacco comments:    Tried vaping  during high school for 2 months. Last cigar smoked was one month ago.  Vaping Use   Vaping Use: Never used  Substance Use Topics   Alcohol use: Yes    Alcohol/week: 0.0  standard drinks of alcohol    Comment: social   Drug use: No     Allergies   Amoxicillin and Lamictal [lamotrigine]   Review of Systems Review of Systems   Physical Exam Triage Vital Signs ED Triage Vitals  Enc Vitals Group     BP 11/08/22 1512 119/69     Pulse Rate 11/08/22 1512 98     Resp 11/08/22 1512 20     Temp 11/08/22 1512 100.1 F (37.8 C)     Temp Source 11/08/22 1512 Oral     SpO2 11/08/22 1512 99 %     Weight --      Height --      Head Circumference --      Peak Flow --      Pain Score 11/08/22 1510 7     Pain Loc --      Pain Edu? --      Excl. in [place]? --    No data found.  Updated Vital Signs BP 119/69 (BP Location: Left Arm) Comment (BP Location): large cuff  Pulse 98   Temp 100.1 F (37.8 C) (Oral)   Resp 20   SpO2 99%   Visual Acuity Right Eye Distance:   Left Eye Distance:   Bilateral Distance:    Right Eye Near:   Left Eye Near:    Bilateral Near:     Physical Exam Constitutional:      Appearance: Normal appearance.  HENT:     Right Ear: Hearing, tympanic membrane, ear canal and external ear normal.     Left Ear: Hearing, ear canal and external ear normal. Tympanic membrane is erythematous.     Nose: Nose normal.     Mouth/Throat:     Mouth: Mucous membranes are moist.     Pharynx: Oropharynx is clear. Posterior oropharyngeal erythema present.     Tonsils: No tonsillar exudate. 0 on the right. 0 on the left.  Cardiovascular:     Rate and Rhythm: Normal rate and regular rhythm.     Pulses: Normal pulses.     Heart sounds: Normal heart sounds.  Pulmonary:     Effort: Pulmonary effort is normal.     Breath sounds: Wheezing present.  Musculoskeletal:     Comments: Tenderness is present to the anterior of the right knee without ecchymosis or deformity, mild to moderate swelling present over the joint space, 2+ popliteal pulse, minimal effort given to bear weight into complete range of motion due to pain elicited, able to passively  extend and flex, no ligament laxity noted  Feet:     Comments: Fungus and maceration is present between all digit webs on the bilateral feet, no erythema,  swelling or tenderness or drainage Skin:    General: Skin is warm and dry.  Neurological:     General: No focal deficit present.     Mental Status: He is alert and oriented to person, place, and time. Mental status is at baseline.     Cranial Nerves: No cranial nerve deficit.     Motor: No weakness.  Psychiatric:        Mood and Affect: Mood normal.        Behavior: Behavior normal.      UC Treatments / Results  Labs (all labs ordered are listed, but only abnormal results are displayed) Labs Reviewed  RAPID INFLUENZA A&B ANTIGENS  SARS CORONAVIRUS 2 BY RT PCR    EKG   Radiology No results found.  Procedures Procedures (including critical care time)  Medications Ordered in UC Medications - No data to display  Initial Impression / Assessment and Plan / UC Course  I have reviewed the triage vital signs and the nursing notes.  Pertinent labs & imaging results that were available during my care of the patient were reviewed by me and considered in my medical decision making (see chart for details).  Acute pain of right knee Nonrecurrent acute serous otitis media of left ear Tinea pedis of both feet  x-ray is negative for injury to the bone, show small joint effusion, most likely resolved with time, discussed all findings with patient, recommended follow-up with his orthopedic specialist if symptoms continue to persist or worsen, placed in knee compression and given crutches for stability and support, recommended RICE for additional management, prescribed prednisone and ibuprofen 800 mg for after completion  On exam there is significant erythema present to the left, no erythema, drainage or swelling present to the ear canal findings with patient, amoxicillin prescribed for, COVID and flu testing is negative, discussed  findings with patient, offered Toradol injection, declined for management of headache, prednisone prescribed will also help with headache as well as ibuprofen 800 mg, may use Tylenol additionally, recommended less additional relation activity while headache is present with increase fluid intake and adequate rest to prevent exacerbation of symptoms  Presentation of the feet consistent with above diagnoses, clotrimazole cream prescribed and discussed administration, advised keeping toes clean and dry with monitor today no supportive measures, may follow-up with his primary doctor as needed Final diagnoses:  None   Discharge Instructions   None    ED Prescriptions   None    PDMP not reviewed this encounter.   Hans Eden, NP 11/08/22 1909

## 2022-11-08 NOTE — ED Triage Notes (Signed)
Headache and bilateral leg pain started yesterday.  Patient complains of a cough.  Patient has taken tylenol today

## 2022-11-08 NOTE — ED Notes (Signed)
No answer

## 2022-11-08 NOTE — Discharge Instructions (Addendum)
X-ray of your knee shows a small joint effusion, which means there is a small amount of fluid within the joint space of your knee, this can occur with injury and typically is reabsorbed into the tissue with time, does not need treatment, unfortunately x-rays do not show ligaments and tendons and therefore it is possible that you still have an injury, please schedule follow-up appointment with your orthopedic specialist for further evaluation and management  You have been given a knee brace to wear when completing activities for stability and support as well as crutches  You have declined an injection of Toradol today for management of your headache, begin prednisone every morning with food for 5 days, this medicine helps to reduce inflammation and will help with your pain, you may take Tylenol 500 to 1000 mg every 6 hours in addition to this ,steroids are also for knee pain  Once steroid is completed you may use ibuprofen 800 mg as needed for pain and/or Tylenol 500 to 1000 mg every 6 hours for management  Participate in low stimulation activities with avoidance of bright lights and loud noises while headache is present  Ensure that you are getting adequate fluid intake as dehydration will worsen your symptoms  COVID and flu testing are negative  On exam there is significant redness to the right ear which is an indication of a bacterial infection  Begin cefdinir every morning and every evening for 10 days, daily you will see improvement in about 48 hours and steady progression from there  For cough: honey 1/2 to 1 teaspoon (you can dilute the honey in water or another fluid).  You can also use guaifenesin and dextromethorphan for cough. You can use a humidifier for chest congestion and cough.  If you don't have a humidifier, you can sit in the bathroom with the hot shower running.      For sore throat: try warm salt water gargles, cepacol lozenges, throat spray, warm tea or water with  lemon/honey, popsicles or ice, or OTC cold relief medicine for throat discomfort.   For congestion: take a daily anti-histamine like Zyrtec, Claritin, and a oral decongestant, such as pseudoephedrine.  You can also use Flonase 1-2 sprays in each nostril daily.   It is important to stay hydrated: drink plenty of fluids (water, gatorade/powerade/pedialyte, juices, or teas) to keep your throat moisturized and help further relieve irritation/discomfort.\  For your feet, it is consistent with fungus which courses in a warm dark environment such as between toes  You will need to consistently apply clotrimazole cream every morning and every evening until cleared, once cleared use for an additional week, if you run out of prescribed cream you may purchase this medicine over-the-counter  Ensure that your feet and toes stay clean and dry, this may require you to change socks more often or dry toast throughout the daytime  If your knee pain continues to persist past use of your medication please follow-up with your orthopedic specialist at [facility] clinic for further evaluation and management

## 2022-11-11 ENCOUNTER — Encounter: Payer: Self-pay | Admitting: Emergency Medicine

## 2022-11-11 ENCOUNTER — Ambulatory Visit
Admission: EM | Admit: 2022-11-11 | Discharge: 2022-11-11 | Disposition: A | Discharge status: Home / Self Care | Payer: Medicaid Other

## 2022-11-11 DIAGNOSIS — M25561 Pain in right knee: Secondary | ICD-10-CM

## 2022-11-11 NOTE — ED Provider Notes (Signed)
[facility]    CSN: [id number] Arrival date & time: 11/11/22  1745      History   Chief Complaint Chief Complaint  Patient presents with   Knee Pain   Letter for School/Work    HPI Jordan Ferguson is a 27 y.o. male.   Patient presents today requesting extension of work note for acute right knee pain.  Experienced a fall 3 days ago making it difficult to bear weight.  Was evaluated here in this urgent care on 11/08/2022, x-ray negative.  Prescribed prednisone, on day 4 out of 5.  Endorses swelling has improved but still experiencing some pain.  Has been using knee brace and crutches for stability and support.    Past Medical History:  Diagnosis Date   Asthma    Reflux    Seizures ([place])    None in many years, no meds for years    Patient Active Problem List   Diagnosis Date Noted   Skin rash 01/19/2022   Stress [id number]   Medical certificate issuance 04/22/2020   Left knee injury 03/12/2020   Leukopenia 01/03/2018   Health maintenance examination 01/27/2016   Social anxiety disorder 01/27/2016   Seasonal allergic rhinitis 11/26/2014   Mild intermittent asthma     Past Surgical History:  Procedure Laterality Date   CONCEALED [place] EXTRACTION  2017       Home Medications    Prior to Admission medications   Medication Sig Start Date End Date Taking? Authorizing Provider  albuterol (VENTOLIN HFA) 108 (90 Base) MCG/ACT inhaler INHALE 2 PUFFS BY MOUTH EVERY 4 HOURS AS NEEDED FOR WHEEZE OR FOR SHORTNESS OF BREATH 01/27/22   Ria Bush, MD  BREO ELLIPTA 100-25 MCG/INH AEPB INHALE 1 PUFF INTO THE LUNGS DAILY AS NEEDED (DYSPNEA, WHEEZE). 05/18/21   Ria Bush, MD  cefdinir (OMNICEF) 300 MG capsule Take 1 capsule (300 mg total) by mouth 2 (two) times daily for 10 days. 11/08/22 11/18/22  Hans Eden, NP  Cetirizine HCl (ZYRTEC ALLERGY) 10 MG CAPS Take 1 capsule (10 mg total) by mouth daily.  11/26/14   Ria Bush, MD  clotrimazole (CLOTRIMAZOLE ANTI-FUNGAL) 1 % cream Apply 1 Application topically 2 (two) times daily. 11/08/22   Zakariyah Freimark, Leitha Schuller, NP  fluticasone (FLONASE) 50 MCG/ACT nasal spray Place 1 spray into both nostrils daily. 01/03/18   Ria Bush, MD  ibuprofen (ADVIL) 800 MG tablet Take 1 tablet (800 mg total) by mouth 3 (three) times daily. 11/08/22   Karim Aiello, Leitha Schuller, NP  levalbuterol (XOPENEX) 1.25 MG/3ML nebulizer solution Take 1.25 mg by nebulization every 6 (six) hours as needed for wheezing or shortness of breath. 01/27/22 08/27/23  Ria Bush, MD  predniSONE (DELTASONE) 20 MG tablet Take 2 tablets (40 mg total) by mouth daily. 11/08/22   Malosi Hemstreet, Leitha Schuller, NP  sodium chloride (OCEAN) 0.65 % SOLN nasal spray Place 2 sprays into both nostrils every 2 (two) hours while awake. 09/06/15   Betancourt, Aura Fey, NP  famotidine (PEPCID) 20 MG tablet Take 20 mg by mouth daily as needed for heartburn or indigestion.  09/10/19  [provider]  montelukast (SINGULAIR) 10 MG tablet Take 1 tablet (10 mg total) by mouth at bedtime. 01/27/16 09/10/19  Ria Bush, MD    Family History Family History  Problem Relation Age of Onset   Cancer Maternal Aunt 50       lung (smoker)  Stroke Maternal Uncle 80   CAD Neg Hx    Diabetes Neg Hx     Social History Social History   Tobacco Use   Smoking status: Some Days    Types: Cigars   Smokeless tobacco: Never   Tobacco comments:    Tried vaping  during high school for 2 months. Last cigar smoked was one month ago.  Vaping Use   Vaping Use: Never used  Substance Use Topics   Alcohol use: Yes    Alcohol/week: 0.0 standard drinks of alcohol    Comment: social   Drug use: No     Allergies   Amoxicillin and Lamictal [lamotrigine]   Review of Systems Review of Systems   Physical Exam Triage Vital Signs ED Triage Vitals  Enc Vitals Group     BP 11/11/22 1757 128/73     Pulse Rate 11/11/22  1757 65     Resp 11/11/22 1757 15     Temp 11/11/22 1757 (!) 97.4 F (36.3 C)     Temp Source 11/11/22 1757 Oral     SpO2 11/11/22 1757 95 %     Weight 11/11/22 1756 252 lb 6.8 oz (114.5 kg)     Height 11/11/22 1756 6' 4"$  (1.93 m)     Head Circumference --      Peak Flow --      Pain Score 11/11/22 1756 9     Pain Loc --      Pain Edu? --      Excl. in [place]? --    No data found.  Updated Vital Signs BP 128/73 (BP Location: Right Arm)   Pulse 65   Temp (!) 97.4 F (36.3 C) (Oral)   Resp 15   Ht 6' 4"$  (1.93 m)   Wt 252 lb 6.8 oz (114.5 kg)   SpO2 95%   BMI 30.73 kg/m   Visual Acuity Right Eye Distance:   Left Eye Distance:   Bilateral Distance:    Right Eye Near:   Left Eye Near:    Bilateral Near:     Physical Exam Constitutional:      Appearance: Normal appearance.  Eyes:     Extraocular Movements: Extraocular movements intact.  Pulmonary:     Effort: Pulmonary effort is normal.  Musculoskeletal:     Comments: Tenderness and swelling present to the anterior knee, able to bear weight with brace in place, able to extend and flex, 2+ popliteal pulse  Neurological:     Mental Status: He is alert and oriented to person, place, and time. Mental status is at baseline.      UC Treatments / Results  Labs (all labs ordered are listed, but only abnormal results are displayed) Labs Reviewed - No data to display  EKG   Radiology No results found.  Procedures Procedures (including critical care time)  Medications Ordered in UC Medications - No data to display  Initial Impression / Assessment and Plan / UC Course  I have reviewed the triage vital signs and the nursing notes.  Pertinent labs & imaging results that were available during my care of the patient were reviewed by me and considered in my medical decision making (see chart for details).  Acute right knee pain  Recommended continued treatment plan discussed at initial visit, recommended continued  use of brace and crutches for stability and support, advised follow-up with his orthopedic doctor if symptoms continue to persist, work note given Final Clinical Impressions(s) / UC Diagnoses  Final diagnoses:  None   Discharge Instructions   None    ED Prescriptions   None    PDMP not reviewed this encounter.   Hans Eden, NP 11/11/22 [id number]

## 2022-11-11 NOTE — ED Triage Notes (Signed)
Patient c/o right knee pain that has not improved.  Patient was seen here on 11/08/22.  Patient needs another work note.

## 2022-11-11 NOTE — Discharge Instructions (Signed)
Continue treatment plan as discussed at initial visit  After completion of steroid she may begin use of the ibuprofen along with Tylenol for pain management  Continue use of knee brace and crutches as needed for stability and support  If symptoms continue to persist please follow-up with your orthopedic doctor for further evaluation and management

## 2022-11-13 NOTE — Progress Notes (Deleted)
    Jordan Holcomb T. Kobi Aller, MD, [place] at [facility] [place], [address]  Phone: [phone]  FAX: [place] - 27 y.o. male  MRN [id number]  Date of Birth: 1996-01-14  Date: 11/14/2022  PCP: Ria Bush, MD  Referral: Ria Bush, MD  No chief complaint on file.  Subjective:   Jordan Ferguson is a 27 y.o. very pleasant male patient with There is no height or weight on file to calculate BMI. who presents with the following:  He is a pleasant gentleman, and I remember him well.  I saw him previously for some prolonged postconcussive symptoms, and he presents today with some ongoing knee pain.  He did have a knee injury to the right side.  Recently, he has been seen in urgent care twice.  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    Review of Systems is noted in the HPI, as appropriate  Objective:   There were no vitals taken for this visit.  GEN: No acute distress; alert,appropriate. PULM: Breathing comfortably in no respiratory distress PSYCH: Normally interactive.   Laboratory and Imaging Data:  Assessment and Plan:   ***

## 2022-11-14 ENCOUNTER — Encounter: Payer: Self-pay | Admitting: Family Medicine

## 2022-11-14 ENCOUNTER — Ambulatory Visit: Payer: Self-pay | Admitting: Family Medicine

## 2022-11-14 DIAGNOSIS — M25561 Pain in right knee: Secondary | ICD-10-CM

## 2023-01-23 ENCOUNTER — Encounter: Payer: Self-pay | Admitting: Family Medicine

## 2023-01-23 NOTE — Progress Notes (Deleted)
Ph: [phone]       Fax: [phone]   Patient ID: Jordan Ferguson, male    DOB: 1996/07/05, 27 y.o.   MRN: [id number]  This visit was conducted in person.  There were no vitals taken for this visit.   CC: CPE Subjective:   HPI: Jordan Ferguson is a 27 y.o. male presenting on 01/23/2023 for No chief complaint on file.   Currently sexually active - 2-3 partners in the past year. Within the past year tested positive for gonorrhea and treated, no retesting done since. Not using protection. No urethral discharge, abd pain, dysuria. Notes itchy rash to groin - ?ingrown hairs.    Asthma - only using PRN albuterol - twice a week. Triggers are cat and dog hair as well as pollen. Not using breo ellipta. Also albuterol inh or xopenex neb PRN - last used last night. Also takes zyrtec PRN and flonase PRN for allergic rhinitis flare (worse in fall).    Saw ortho last 01/2022 for h/o chronic L ACL tear, lat meniscus tear, med meniscus tear. Updated MRI at that time, then lost insurance.   Preventative: Flu shot yearly Tdap 2008, declines COVID vaccine - did not complete Seat belt use discussed. Sunscreen use discussed. No changing moles on skin.  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 Interim medical history since our last visit reviewed. Allergies and medications reviewed and updated. Outpatient Medications Prior to Visit  Medication Sig Dispense Refill   albuterol (VENTOLIN HFA) 108 (90 Base) MCG/ACT inhaler INHALE 2 PUFFS BY MOUTH  EVERY 4 HOURS AS NEEDED FOR WHEEZE OR FOR SHORTNESS OF BREATH 18 each 3   BREO ELLIPTA 100-25 MCG/INH AEPB INHALE 1 PUFF INTO THE LUNGS DAILY AS NEEDED (DYSPNEA, WHEEZE). 60 each 3   Cetirizine HCl (ZYRTEC ALLERGY) 10 MG CAPS Take 1 capsule (10 mg total) by mouth daily.     clotrimazole (CLOTRIMAZOLE ANTI-FUNGAL) 1 % cream Apply 1 Application topically 2 (two) times daily. 30 g 0   fluticasone (FLONASE) 50 MCG/ACT nasal spray Place 1 spray into both nostrils daily. 16 g 3   ibuprofen (ADVIL) 800 MG tablet Take 1 tablet (800 mg total) by mouth 3 (three) times daily. 21 tablet 0   levalbuterol (XOPENEX) 1.25 MG/3ML nebulizer solution Take 1.25 mg by nebulization every 6 (six) hours as needed for wheezing or shortness of breath. 72 mL 1   predniSONE (DELTASONE) 20 MG tablet Take 2 tablets (40 mg total) by mouth daily. 10 tablet 0   sodium chloride (OCEAN) 0.65 % SOLN nasal spray Place 2 sprays into both nostrils every 2 (two) hours while awake.  0   No facility-administered medications prior to visit.     Per HPI unless specifically indicated in ROS section below Review of Systems  Objective:  There were no vitals taken for this visit.  Wt Readings from Last 3 Encounters:  11/11/22 252 lb 6.8 oz (114.5 kg)  01/19/22 252 lb 6 oz (114.5 kg)  08/13/21 235 lb (106.6  kg)      Physical Exam    Results for orders placed or performed during the hospital encounter of 11/08/22  Rapid Influenza A&B Antigens   Specimen: Respiratory  Result Value Ref Range   Influenza A ([facility]) NEGATIVE NEGATIVE   Influenza B ([facility]) NEGATIVE NEGATIVE  SARS Coronavirus 2 by RT PCR (hospital order, performed in [facility] hospital lab) *cepheid single result test*   Specimen: Nasal Swab  Result Value Ref Range   SARS Coronavirus 2 by RT PCR NEGATIVE NEGATIVE    Assessment & Plan:   Problem List Items Addressed This Visit   None    No orders of the defined types were placed in this encounter.   No orders  of the defined types were placed in this encounter.   There are no Patient Instructions on file for this visit.  Follow up plan: No follow-ups on file.  Eustaquio Boyden, MD

## 2023-01-24 ENCOUNTER — Encounter: Payer: Self-pay | Admitting: Family Medicine

## 2023-06-09 ENCOUNTER — Telehealth: Payer: Self-pay | Admitting: Family Medicine

## 2023-06-09 NOTE — Telephone Encounter (Signed)
-----   Message from Jordan Ferguson sent at 01/23/2023  4:56 PM EDT ----- [name] this patient missed appt today - 2nd time he no showed this year. Missed appt with Dr Patsy Lager 10/2022.  Can we send another no show letter?  Thanks!  Wynona Canes

## 2023-06-09 NOTE — Telephone Encounter (Signed)
LVM for patient via phone on file today to follow-up about missed appointments
